# Patient Record
Sex: Male | Born: 1950
Health system: Southern US, Community
[De-identification: ages and names within clinical notes are randomized; demographics above are authoritative.]

## PROBLEM LIST (undated history)

## (undated) DIAGNOSIS — K219 Gastro-esophageal reflux disease without esophagitis: Secondary | ICD-10-CM

## (undated) DIAGNOSIS — N529 Male erectile dysfunction, unspecified: Secondary | ICD-10-CM

## (undated) HISTORY — DX: Male erectile dysfunction, unspecified: N52.9

## (undated) HISTORY — DX: Gastro-esophageal reflux disease without esophagitis: K21.9

---

## 1989-06-26 HISTORY — PX: URETHRA SURGERY: SHX824

## 2005-04-07 ENCOUNTER — Ambulatory Visit: Payer: Self-pay | Admitting: General Surgery

## 2005-05-13 ENCOUNTER — Ambulatory Visit: Payer: Self-pay | Admitting: General Surgery

## 2008-09-13 ENCOUNTER — Ambulatory Visit: Payer: Self-pay | Admitting: Urology

## 2010-05-05 ENCOUNTER — Ambulatory Visit: Payer: Self-pay | Admitting: Gastroenterology

## 2010-05-06 LAB — PATHOLOGY REPORT

## 2015-02-14 ENCOUNTER — Encounter: Payer: Self-pay | Admitting: Family Medicine

## 2015-02-14 DIAGNOSIS — K589 Irritable bowel syndrome without diarrhea: Secondary | ICD-10-CM

## 2015-02-14 DIAGNOSIS — K5730 Diverticulosis of large intestine without perforation or abscess without bleeding: Secondary | ICD-10-CM

## 2015-02-14 DIAGNOSIS — Z862 Personal history of diseases of the blood and blood-forming organs and certain disorders involving the immune mechanism: Secondary | ICD-10-CM

## 2015-02-14 DIAGNOSIS — E785 Hyperlipidemia, unspecified: Secondary | ICD-10-CM

## 2015-02-14 DIAGNOSIS — E559 Vitamin D deficiency, unspecified: Secondary | ICD-10-CM

## 2015-02-14 DIAGNOSIS — N529 Male erectile dysfunction, unspecified: Secondary | ICD-10-CM

## 2015-02-14 DIAGNOSIS — N135 Crossing vessel and stricture of ureter without hydronephrosis: Secondary | ICD-10-CM

## 2015-02-14 DIAGNOSIS — K5790 Diverticulosis of intestine, part unspecified, without perforation or abscess without bleeding: Secondary | ICD-10-CM | POA: Insufficient documentation

## 2015-03-27 ENCOUNTER — Telehealth: Payer: Self-pay | Admitting: Family Medicine

## 2015-03-27 NOTE — Telephone Encounter (Signed)
Pt informed his insurance will not cover Cialis

## 2015-04-11 ENCOUNTER — Other Ambulatory Visit: Payer: Self-pay | Admitting: Family Medicine

## 2015-06-24 ENCOUNTER — Other Ambulatory Visit: Payer: Self-pay

## 2015-06-24 DIAGNOSIS — N522 Drug-induced erectile dysfunction: Secondary | ICD-10-CM

## 2015-06-24 MED ORDER — TADALAFIL 20 MG PO TABS: 10.0000 mg | ORAL_TABLET | Freq: Every day | ORAL | Status: DC | PRN

## 2015-08-13 ENCOUNTER — Other Ambulatory Visit: Payer: Self-pay

## 2015-12-24 DIAGNOSIS — H2513 Age-related nuclear cataract, bilateral: Secondary | ICD-10-CM | POA: Diagnosis not present

## 2015-12-24 DIAGNOSIS — H18413 Arcus senilis, bilateral: Secondary | ICD-10-CM | POA: Diagnosis not present

## 2015-12-24 DIAGNOSIS — H401231 Low-tension glaucoma, bilateral, mild stage: Secondary | ICD-10-CM | POA: Diagnosis not present

## 2015-12-24 DIAGNOSIS — H47233 Glaucomatous optic atrophy, bilateral: Secondary | ICD-10-CM | POA: Diagnosis not present

## 2016-01-30 DIAGNOSIS — H18413 Arcus senilis, bilateral: Secondary | ICD-10-CM | POA: Diagnosis not present

## 2016-01-30 DIAGNOSIS — H5203 Hypermetropia, bilateral: Secondary | ICD-10-CM | POA: Diagnosis not present

## 2016-01-30 DIAGNOSIS — H2513 Age-related nuclear cataract, bilateral: Secondary | ICD-10-CM | POA: Diagnosis not present

## 2016-01-30 DIAGNOSIS — H47233 Glaucomatous optic atrophy, bilateral: Secondary | ICD-10-CM | POA: Diagnosis not present

## 2016-01-30 DIAGNOSIS — H52223 Regular astigmatism, bilateral: Secondary | ICD-10-CM | POA: Diagnosis not present

## 2016-01-30 DIAGNOSIS — H401231 Low-tension glaucoma, bilateral, mild stage: Secondary | ICD-10-CM | POA: Diagnosis not present

## 2016-03-10 ENCOUNTER — Ambulatory Visit (INDEPENDENT_AMBULATORY_CARE_PROVIDER_SITE_OTHER): Payer: Medicare Other | Admitting: Family Medicine

## 2016-03-10 ENCOUNTER — Encounter: Payer: Self-pay | Admitting: Family Medicine

## 2016-03-10 VITALS — BP 120/70 | HR 70 | Ht 75.0 in | Wt 187.0 lb

## 2016-03-10 DIAGNOSIS — K589 Irritable bowel syndrome without diarrhea: Secondary | ICD-10-CM

## 2016-03-10 DIAGNOSIS — E785 Hyperlipidemia, unspecified: Secondary | ICD-10-CM | POA: Diagnosis not present

## 2016-03-10 DIAGNOSIS — H409 Unspecified glaucoma: Secondary | ICD-10-CM | POA: Diagnosis not present

## 2016-03-10 DIAGNOSIS — N529 Male erectile dysfunction, unspecified: Secondary | ICD-10-CM

## 2016-03-10 DIAGNOSIS — Z23 Encounter for immunization: Secondary | ICD-10-CM | POA: Diagnosis not present

## 2016-03-10 DIAGNOSIS — E559 Vitamin D deficiency, unspecified: Secondary | ICD-10-CM | POA: Diagnosis not present

## 2016-03-10 MED ORDER — TADALAFIL 20 MG PO TABS: 10.0000 mg | ORAL_TABLET | ORAL | Status: DC | PRN

## 2016-03-10 MED ORDER — TADALAFIL 20 MG PO TABS: 10.0000 mg | ORAL_TABLET | Freq: Every day | ORAL | Status: DC | PRN

## 2016-03-10 NOTE — Progress Notes (Signed)
Date:  03/10/2016   Name:  Patrick Mcknight   DOB:  January 07, 1951   MRN:  [id number]  PCP:  No primary care provider on file.    Chief Complaint: Follow-up   History of Present Illness:  This is a 65 y.o. male seen in one year f/u. Doing well. Remains active as softball umpire. Was unable to get Cialis covered last year but has changed insurance and would like to try again. Intolerant Viagra (nausea) and Levitra (headache) in past. Taking asa daily for primary prevention. IBS sxs stable. Seeing ophtho q72m for glaucoma, on Lumigan. Takes vit D 4000 units daily, vit D level last year 45.  Review of Systems:  Review of Systems  Constitutional: Negative for fever and fatigue.  Respiratory: Negative for cough and shortness of breath.   Cardiovascular: Negative for chest pain and leg swelling.  Endocrine: Negative for polyuria.  Genitourinary: Negative for difficulty urinating.  Neurological: Negative for syncope and light-headedness.    Patient Active Problem List   Diagnosis Date Noted  . Diverticulosis 02/14/2015  . Irritable bowel syndrome (IBS) 02/14/2015  . Hx of iron deficiency anemia 02/14/2015  . Hyperlipemia intolerant statins 02/14/2015  . Vitamin D deficiency 02/14/2015  . Erectile dysfunction 02/14/2015  . Ureteral stricture s/p stent 02/14/2015    Prior to Admission medications   Medication Sig Start Date End Date Taking? Authorizing Provider  Cholecalciferol (VITAMIN D) 2000 UNITS tablet Take 4,000 Units by mouth daily.   Yes Historical Provider, MD  LUMIGAN 0.01 % SOLN Place 1 drop into both eyes at bedtime. 02/27/15  Yes Historical Provider, MD  Multiple Vitamin (MULTIVITAMIN) tablet Take 1 tablet by mouth daily.   Yes Historical Provider, MD  tadalafil (CIALIS) 20 MG tablet Take 0.5 tablets (10 mg total) by mouth daily as needed for erectile dysfunction. 03/10/16   Schuyler Amor, MD    Allergies  Allergen Reactions  . Crestor [Rosuvastatin]     kidney pain    Past  Surgical History  Procedure Laterality Date  . Urethra surgery  1990's    stent placement    Social History  Substance Use Topics  . Smoking status: Never Smoker   . Smokeless tobacco: None  . Alcohol Use: 0.0 oz/week    0 Standard drinks or equivalent per week    History reviewed. No pertinent family history.  Medication list has been reviewed and updated.  Physical Examination: BP 120/70 mmHg  Pulse 70  Ht  (1.905 m)  Wt 187 lb (84.823 kg)  BMI 23.37 kg/m2  Physical Exam  Constitutional: He appears well-developed and well-nourished.  HENT:  Right Ear: External ear normal.  Left Ear: External ear normal.  Mouth/Throat: Oropharynx is clear and moist.  Eyes: Conjunctivae are normal. Pupils are equal, round, and reactive to light.  Neck: Neck supple. No thyromegaly present.  Cardiovascular: Normal rate, regular rhythm and normal heart sounds.   Pulmonary/Chest: Effort normal and breath sounds normal.  Abdominal: Soft. He exhibits no distension and no mass. There is no tenderness.  Musculoskeletal: He exhibits no edema.  Lymphadenopathy:    He has no cervical adenopathy.  Neurological: He is alert. Coordination normal.  Skin: Skin is warm and dry.  Psychiatric: He has a normal mood and affect. His behavior is normal.  Nursing note and vitals reviewed.   Assessment and Plan:  1. Erectile dysfunction, unspecified erectile dysfunction type Reorder Cialis, insurance should cover given past intolerance Viagra and Levitra  2. Vitamin D deficiency  Well controlled on supplement  3. Irritable bowel syndrome (IBS) Sxs stable  4. Hyperlipemia intolerant statins TC 251 HDL 67 last year, 10 yr CVR 8.7%, recommend d/c daily asa  5. Glaucoma Followed by ophtho  6. Need for pneumococcal vaccination Prevnar now, plan Pneumovax next year - Pneumococcal conjugate vaccine 13-valent  Return in about 1 year (around 03/10/2017).  Dionne AnoWilliam M. Kingsley SpittlePlonk, Jr. MD [facility]  03/10/2016

## 2016-03-25 DIAGNOSIS — H2513 Age-related nuclear cataract, bilateral: Secondary | ICD-10-CM | POA: Diagnosis not present

## 2016-03-25 DIAGNOSIS — H5203 Hypermetropia, bilateral: Secondary | ICD-10-CM | POA: Diagnosis not present

## 2016-03-25 DIAGNOSIS — H47233 Glaucomatous optic atrophy, bilateral: Secondary | ICD-10-CM | POA: Diagnosis not present

## 2016-03-25 DIAGNOSIS — H18413 Arcus senilis, bilateral: Secondary | ICD-10-CM | POA: Diagnosis not present

## 2016-03-25 DIAGNOSIS — H401231 Low-tension glaucoma, bilateral, mild stage: Secondary | ICD-10-CM | POA: Diagnosis not present

## 2016-03-25 DIAGNOSIS — H52223 Regular astigmatism, bilateral: Secondary | ICD-10-CM | POA: Diagnosis not present

## 2016-07-15 DIAGNOSIS — H18413 Arcus senilis, bilateral: Secondary | ICD-10-CM | POA: Diagnosis not present

## 2016-07-15 DIAGNOSIS — H2513 Age-related nuclear cataract, bilateral: Secondary | ICD-10-CM | POA: Diagnosis not present

## 2016-07-15 DIAGNOSIS — H52223 Regular astigmatism, bilateral: Secondary | ICD-10-CM | POA: Diagnosis not present

## 2016-07-15 DIAGNOSIS — H401231 Low-tension glaucoma, bilateral, mild stage: Secondary | ICD-10-CM | POA: Diagnosis not present

## 2016-07-15 DIAGNOSIS — H47233 Glaucomatous optic atrophy, bilateral: Secondary | ICD-10-CM | POA: Diagnosis not present

## 2016-07-15 DIAGNOSIS — H5203 Hypermetropia, bilateral: Secondary | ICD-10-CM | POA: Diagnosis not present

## 2016-09-08 DIAGNOSIS — R350 Frequency of micturition: Secondary | ICD-10-CM | POA: Diagnosis not present

## 2016-09-08 DIAGNOSIS — N401 Enlarged prostate with lower urinary tract symptoms: Secondary | ICD-10-CM | POA: Diagnosis not present

## 2016-09-08 DIAGNOSIS — N5201 Erectile dysfunction due to arterial insufficiency: Secondary | ICD-10-CM | POA: Diagnosis not present

## 2016-11-17 DIAGNOSIS — H47233 Glaucomatous optic atrophy, bilateral: Secondary | ICD-10-CM | POA: Diagnosis not present

## 2016-11-17 DIAGNOSIS — H401231 Low-tension glaucoma, bilateral, mild stage: Secondary | ICD-10-CM | POA: Diagnosis not present

## 2016-11-17 DIAGNOSIS — H52223 Regular astigmatism, bilateral: Secondary | ICD-10-CM | POA: Diagnosis not present

## 2016-11-17 DIAGNOSIS — H5203 Hypermetropia, bilateral: Secondary | ICD-10-CM | POA: Diagnosis not present

## 2016-11-17 DIAGNOSIS — H2513 Age-related nuclear cataract, bilateral: Secondary | ICD-10-CM | POA: Diagnosis not present

## 2016-11-17 DIAGNOSIS — H18413 Arcus senilis, bilateral: Secondary | ICD-10-CM | POA: Diagnosis not present

## 2017-02-15 DIAGNOSIS — H401231 Low-tension glaucoma, bilateral, mild stage: Secondary | ICD-10-CM | POA: Diagnosis not present

## 2017-02-15 DIAGNOSIS — H18413 Arcus senilis, bilateral: Secondary | ICD-10-CM | POA: Diagnosis not present

## 2017-02-15 DIAGNOSIS — H2513 Age-related nuclear cataract, bilateral: Secondary | ICD-10-CM | POA: Diagnosis not present

## 2017-02-15 DIAGNOSIS — H47233 Glaucomatous optic atrophy, bilateral: Secondary | ICD-10-CM | POA: Diagnosis not present

## 2017-03-09 ENCOUNTER — Ambulatory Visit (INDEPENDENT_AMBULATORY_CARE_PROVIDER_SITE_OTHER): Payer: Medicare Other | Admitting: Family Medicine

## 2017-03-09 ENCOUNTER — Encounter: Payer: Self-pay | Admitting: Family Medicine

## 2017-03-09 VITALS — BP 120/78 | HR 58 | Resp 16 | Ht 75.0 in | Wt 182.0 lb

## 2017-03-09 DIAGNOSIS — E559 Vitamin D deficiency, unspecified: Secondary | ICD-10-CM

## 2017-03-09 DIAGNOSIS — Z23 Encounter for immunization: Secondary | ICD-10-CM | POA: Diagnosis not present

## 2017-03-09 DIAGNOSIS — K589 Irritable bowel syndrome without diarrhea: Secondary | ICD-10-CM

## 2017-03-09 DIAGNOSIS — N529 Male erectile dysfunction, unspecified: Secondary | ICD-10-CM | POA: Diagnosis not present

## 2017-03-09 DIAGNOSIS — H409 Unspecified glaucoma: Secondary | ICD-10-CM | POA: Insufficient documentation

## 2017-03-09 NOTE — Patient Instructions (Addendum)
You may stop aspirin and fish oil.  We will get blood work at your next visit.

## 2017-03-09 NOTE — Progress Notes (Signed)
Date:  03/09/2017   Name:  Patrick Mcknight   DOB:  [birth year]   MRN:  [id number]  PCP:  Schuyler AmorPlonk, Kobyn, MD    Chief Complaint: Erectile Dysfunction (Follow Up ) and Hyperlipidemia (Follow Up - Last notye states need Pneumovax )   History of Present Illness:  This is a 66 y.o. male seen for one year f/u. IBS stable, ED only rarely an issue, insurance never approved Cialis. Never stopped asa or fish oil, prefers to continue. Saw optho for glaucoma 2 weeks ago, remains on Lumigan gtts. Due for Pneumovax today, tet imm 3 yrs ago, colonoscopy age 66 normal. Stays active, exercises daily.  Review of Systems:  Review of Systems  Constitutional: Negative for chills and fever.  Respiratory: Negative for cough and shortness of breath.   Cardiovascular: Negative for chest pain and leg swelling.  Genitourinary: Negative for difficulty urinating.  Neurological: Negative for syncope and light-headedness.    Patient Active Problem List   Diagnosis Date Noted  . Glaucoma 03/09/2017  . Diverticulosis 02/14/2015  . Irritable bowel syndrome (IBS) 02/14/2015  . Hx of iron deficiency anemia 02/14/2015  . Hyperlipemia intolerant statins 02/14/2015  . Vitamin D deficiency 02/14/2015  . Erectile dysfunction 02/14/2015  . Ureteral stricture s/p stent 02/14/2015    Prior to Admission medications   Medication Sig Start Date End Date Taking? Authorizing Provider  aspirin EC 81 MG tablet Take 81 mg by mouth daily.   Yes [provider]  Cholecalciferol (VITAMIN D3) 5000 units CAPS Take 1 capsule by mouth daily.   Yes [provider]  LUMIGAN 0.01 % SOLN Place 1 drop into both eyes at bedtime. 02/27/15  Yes [provider]  Multiple Vitamin (MULTIVITAMIN) tablet Take 1 tablet by mouth daily.   Yes [provider]  Omega-3 Fatty Acids (FISH OIL CONCENTRATE PO) Take by mouth.   Yes [provider]    Allergies  Allergen Reactions  . Crestor [Rosuvastatin]    kidney pain    Past Surgical History:  Procedure Laterality Date  . URETHRA SURGERY  1990's   stent placement    Social History  Substance Use Topics  . Smoking status: Never Smoker  . Smokeless tobacco: Never Used  . Alcohol use 0.0 oz/week    Family History  Problem Relation Age of Onset  . Family history unknown: Yes    Medication list has been reviewed and updated.  Physical Examination: BP 120/78   Pulse (!) 58   Resp 16   Ht 6\' 3"  (1.905 m)   Wt 182 lb (82.6 kg)   SpO2 99%   BMI 22.75 kg/m   Physical Exam  Constitutional: He appears well-developed and well-nourished.  Cardiovascular: Normal rate, regular rhythm and normal heart sounds.   Pulmonary/Chest: Effort normal and breath sounds normal.  Musculoskeletal: He exhibits no edema.  Neurological: He is alert.  Skin: Skin is warm and dry.  Psychiatric: He has a normal mood and affect. His behavior is normal.  Nursing note and vitals reviewed.   Assessment and Plan:  1. Irritable bowel syndrome, unspecified type Stable, cont high fiber diet  2. Erectile dysfunction, unspecified erectile dysfunction type Intermittent only, declines rx  3. Vitamin D deficiency On supplement, consider level next visit  4. Glaucoma, unspecified glaucoma type, unspecified laterality On Lumigan, followed by ophtho  5. Need for pneumococcal vaccination - Pneumococcal polysaccharide vaccine 23-valent greater than or equal to 2yo subcutaneous/IM  6. Med review Recommend d/c asa/fish oil  but pt prefers to continue  Return in about 1 year (around 03/09/2018).  Dionne Ano. Kingsley Spittle MD [facility]  03/09/2017

## 2017-03-11 ENCOUNTER — Ambulatory Visit: Payer: Medicare Other | Admitting: Family Medicine

## 2017-05-18 ENCOUNTER — Encounter: Payer: Self-pay | Admitting: Family Medicine

## 2017-05-18 ENCOUNTER — Ambulatory Visit (INDEPENDENT_AMBULATORY_CARE_PROVIDER_SITE_OTHER): Payer: Medicare Other | Admitting: Family Medicine

## 2017-05-18 VITALS — BP 122/84 | HR 66 | Resp 16 | Ht 75.0 in | Wt 181.0 lb

## 2017-05-18 DIAGNOSIS — H409 Unspecified glaucoma: Secondary | ICD-10-CM | POA: Diagnosis not present

## 2017-05-18 DIAGNOSIS — Z91018 Allergy to other foods: Secondary | ICD-10-CM | POA: Diagnosis not present

## 2017-05-18 DIAGNOSIS — H2513 Age-related nuclear cataract, bilateral: Secondary | ICD-10-CM | POA: Diagnosis not present

## 2017-05-18 DIAGNOSIS — H47233 Glaucomatous optic atrophy, bilateral: Secondary | ICD-10-CM | POA: Diagnosis not present

## 2017-05-18 DIAGNOSIS — H401231 Low-tension glaucoma, bilateral, mild stage: Secondary | ICD-10-CM | POA: Diagnosis not present

## 2017-05-18 DIAGNOSIS — H18413 Arcus senilis, bilateral: Secondary | ICD-10-CM | POA: Diagnosis not present

## 2017-05-18 DIAGNOSIS — R600 Localized edema: Secondary | ICD-10-CM

## 2017-05-18 DIAGNOSIS — E559 Vitamin D deficiency, unspecified: Secondary | ICD-10-CM | POA: Diagnosis not present

## 2017-05-18 MED ORDER — CETIRIZINE HCL 10 MG PO TABS: 10.0000 mg | ORAL_TABLET | Freq: Every day | ORAL | 11 refills | Status: DC | PRN

## 2017-05-18 NOTE — Progress Notes (Signed)
Date:  05/18/2017   Name:  Patrick Mcknight   DOB:  03-05-1951   MRN:  [id number]  PCP:  Schuyler Amor, MD    Chief Complaint: Allergic Reaction (Ate pie and had tea and is not sure if that is what caused lip swelling. Had family reunion. swollwn and sore. )   History of Present Illness:  This is a 66 y.o. male seen for upper lip edema since yesterday, has citrus allergy, thinks exposed to some in pie he ate Sunday night. Better during day yesterday but slightly worse this am, no SOB.  Review of Systems:  Review of Systems  Constitutional: Negative for chills and fever.  Respiratory: Negative for cough and shortness of breath.   Cardiovascular: Negative for chest pain and leg swelling.  Gastrointestinal: Negative for abdominal pain.  Neurological: Negative for syncope and light-headedness.    Patient Active Problem List   Diagnosis Date Noted  . Glaucoma 03/09/2017  . Diverticulosis 02/14/2015  . Irritable bowel syndrome (IBS) 02/14/2015  . Hx of iron deficiency anemia 02/14/2015  . Hyperlipemia intolerant statins 02/14/2015  . Vitamin D deficiency 02/14/2015  . Erectile dysfunction 02/14/2015  . Ureteral stricture s/p stent 02/14/2015    Prior to Admission medications   Medication Sig Start Date End Date Taking? Authorizing Provider  aspirin EC 81 MG tablet Take 81 mg by mouth daily.   Yes [provider]  Cholecalciferol (VITAMIN D3) 5000 units CAPS Take 1 capsule by mouth daily.   Yes [provider]  LUMIGAN 0.01 % SOLN Place 1 drop into both eyes at bedtime. 02/27/15  Yes [provider]  Multiple Vitamin (MULTIVITAMIN) tablet Take 1 tablet by mouth daily.   Yes [provider]  Omega-3 Fatty Acids (FISH OIL CONCENTRATE PO) Take by mouth.   Yes [provider]  cetirizine (ZYRTEC) 10 MG tablet Take 1 tablet (10 mg total) by mouth daily as needed for allergies. 05/18/17   Schuyler Amor, MD    Allergies  Allergen Reactions  .  Crestor [Rosuvastatin]     kidney pain    Past Surgical History:  Procedure Laterality Date  . URETHRA SURGERY  1990's   stent placement    Social History  Substance Use Topics  . Smoking status: Never Smoker  . Smokeless tobacco: Never Used  . Alcohol use 0.0 oz/week    Family History  Problem Relation Age of Onset  . Family history unknown: Yes    Medication list has been reviewed and updated.  Physical Examination: BP 122/84   Pulse 66   Resp 16   Ht 6\' 3"  (1.905 m)   Wt 181 lb (82.1 kg)   SpO2 98%   BMI 22.62 kg/m   Physical Exam  Constitutional: He appears well-developed and well-nourished.  HENT:  Mild-mod upper lip edema, no erythema/tenderness  Cardiovascular: Normal rate, regular rhythm and normal heart sounds.   Pulmonary/Chest: Effort normal and breath sounds normal.  Musculoskeletal: He exhibits no edema.  Neurological: He is alert.  Skin: Skin is warm and dry.  Psychiatric: He has a normal mood and affect. His behavior is normal.  Nursing note and vitals reviewed.   Assessment and Plan:  1. Facial edema Likely due to citrus exposure, Zyrtec prn, call if sxs worsen/persist  2. Food allergy Avoid citrus  3. Vitamin D deficiency On supplement, level next visit  4. Glaucoma, unspecified glaucoma type, unspecified laterality Cont Lumigan per optho  5. Med review Consider d/c fish oil/asa next  visit  Return if symptoms worsen or fail to improve.  Dionne AnoWilliam M. Kingsley SpittlePlonk, Jr. MD [facility]  05/18/2017

## 2017-05-18 NOTE — Patient Instructions (Signed)
Take Zyrtec (generic ok) daily until swelling resolves.

## 2017-06-07 DIAGNOSIS — H47233 Glaucomatous optic atrophy, bilateral: Secondary | ICD-10-CM | POA: Diagnosis not present

## 2017-06-07 DIAGNOSIS — H18413 Arcus senilis, bilateral: Secondary | ICD-10-CM | POA: Diagnosis not present

## 2017-06-07 DIAGNOSIS — H2513 Age-related nuclear cataract, bilateral: Secondary | ICD-10-CM | POA: Diagnosis not present

## 2017-06-07 DIAGNOSIS — H401231 Low-tension glaucoma, bilateral, mild stage: Secondary | ICD-10-CM | POA: Diagnosis not present

## 2017-06-14 ENCOUNTER — Other Ambulatory Visit: Payer: Self-pay

## 2017-09-08 DIAGNOSIS — R350 Frequency of micturition: Secondary | ICD-10-CM | POA: Diagnosis not present

## 2017-09-08 DIAGNOSIS — N401 Enlarged prostate with lower urinary tract symptoms: Secondary | ICD-10-CM | POA: Diagnosis not present

## 2017-09-08 DIAGNOSIS — E291 Testicular hypofunction: Secondary | ICD-10-CM | POA: Diagnosis not present

## 2017-09-14 DIAGNOSIS — H47233 Glaucomatous optic atrophy, bilateral: Secondary | ICD-10-CM | POA: Diagnosis not present

## 2017-09-14 DIAGNOSIS — H2513 Age-related nuclear cataract, bilateral: Secondary | ICD-10-CM | POA: Diagnosis not present

## 2017-09-14 DIAGNOSIS — H18413 Arcus senilis, bilateral: Secondary | ICD-10-CM | POA: Diagnosis not present

## 2017-09-14 DIAGNOSIS — H401231 Low-tension glaucoma, bilateral, mild stage: Secondary | ICD-10-CM | POA: Diagnosis not present

## 2017-12-21 DIAGNOSIS — H47233 Glaucomatous optic atrophy, bilateral: Secondary | ICD-10-CM | POA: Diagnosis not present

## 2017-12-21 DIAGNOSIS — H18413 Arcus senilis, bilateral: Secondary | ICD-10-CM | POA: Diagnosis not present

## 2017-12-21 DIAGNOSIS — H2513 Age-related nuclear cataract, bilateral: Secondary | ICD-10-CM | POA: Diagnosis not present

## 2017-12-21 DIAGNOSIS — H401231 Low-tension glaucoma, bilateral, mild stage: Secondary | ICD-10-CM | POA: Diagnosis not present

## 2017-12-30 DIAGNOSIS — Q6211 Congenital occlusion of ureteropelvic junction: Secondary | ICD-10-CM | POA: Diagnosis not present

## 2017-12-30 DIAGNOSIS — N401 Enlarged prostate with lower urinary tract symptoms: Secondary | ICD-10-CM | POA: Diagnosis not present

## 2017-12-30 DIAGNOSIS — M5489 Other dorsalgia: Secondary | ICD-10-CM | POA: Diagnosis not present

## 2017-12-30 DIAGNOSIS — N5201 Erectile dysfunction due to arterial insufficiency: Secondary | ICD-10-CM | POA: Diagnosis not present

## 2018-01-05 ENCOUNTER — Other Ambulatory Visit: Payer: Self-pay | Admitting: Urology

## 2018-01-05 DIAGNOSIS — Q6211 Congenital occlusion of ureteropelvic junction: Secondary | ICD-10-CM

## 2018-01-05 DIAGNOSIS — M545 Low back pain: Secondary | ICD-10-CM

## 2018-01-05 DIAGNOSIS — R109 Unspecified abdominal pain: Secondary | ICD-10-CM

## 2018-01-05 DIAGNOSIS — Q6239 Other obstructive defects of renal pelvis and ureter: Secondary | ICD-10-CM

## 2018-01-14 ENCOUNTER — Ambulatory Visit
Admission: RE | Admit: 2018-01-14 | Discharge: 2018-01-14 | Disposition: A | Discharge status: Home / Self Care | Payer: Medicare Other | Source: Ambulatory Visit | Attending: Urology | Admitting: Urology

## 2018-01-14 DIAGNOSIS — R109 Unspecified abdominal pain: Secondary | ICD-10-CM

## 2018-01-14 DIAGNOSIS — M545 Low back pain: Secondary | ICD-10-CM

## 2018-01-14 DIAGNOSIS — Q6211 Congenital occlusion of ureteropelvic junction: Secondary | ICD-10-CM | POA: Insufficient documentation

## 2018-01-14 DIAGNOSIS — Q6239 Other obstructive defects of renal pelvis and ureter: Secondary | ICD-10-CM

## 2018-01-14 DIAGNOSIS — I700 Atherosclerosis of aorta: Secondary | ICD-10-CM | POA: Diagnosis not present

## 2018-01-14 LAB — POCT I-STAT CREATININE: Creatinine, Ser: 1.1 mg/dL (ref 0.61–1.24)

## 2018-01-14 MED ORDER — IOPAMIDOL (ISOVUE-300) INJECTION 61%
100.0000 mL | Freq: Once | INTRAVENOUS | Status: AC | PRN
  Administered 2018-01-14: 100 mL via INTRAVENOUS

## 2018-01-17 DIAGNOSIS — N5201 Erectile dysfunction due to arterial insufficiency: Secondary | ICD-10-CM | POA: Diagnosis not present

## 2018-03-07 ENCOUNTER — Ambulatory Visit (INDEPENDENT_AMBULATORY_CARE_PROVIDER_SITE_OTHER): Payer: Medicare Other | Admitting: Family Medicine

## 2018-03-07 ENCOUNTER — Encounter: Payer: Self-pay | Admitting: Family Medicine

## 2018-03-07 ENCOUNTER — Other Ambulatory Visit
Admission: RE | Admit: 2018-03-07 | Discharge: 2018-03-07 | Disposition: A | Discharge status: Home / Self Care | Payer: Medicare Other | Source: Ambulatory Visit | Attending: Family Medicine | Admitting: Family Medicine

## 2018-03-07 VITALS — BP 120/62 | HR 64 | Ht 75.0 in | Wt 180.0 lb

## 2018-03-07 DIAGNOSIS — Z1211 Encounter for screening for malignant neoplasm of colon: Secondary | ICD-10-CM | POA: Diagnosis not present

## 2018-03-07 DIAGNOSIS — N400 Enlarged prostate without lower urinary tract symptoms: Secondary | ICD-10-CM | POA: Insufficient documentation

## 2018-03-07 DIAGNOSIS — I700 Atherosclerosis of aorta: Secondary | ICD-10-CM | POA: Insufficient documentation

## 2018-03-07 DIAGNOSIS — E785 Hyperlipidemia, unspecified: Secondary | ICD-10-CM

## 2018-03-07 DIAGNOSIS — Z0000 Encounter for general adult medical examination without abnormal findings: Secondary | ICD-10-CM | POA: Insufficient documentation

## 2018-03-07 DIAGNOSIS — E559 Vitamin D deficiency, unspecified: Secondary | ICD-10-CM

## 2018-03-07 DIAGNOSIS — Z1159 Encounter for screening for other viral diseases: Secondary | ICD-10-CM | POA: Diagnosis not present

## 2018-03-07 LAB — RENAL FUNCTION PANEL
Albumin: 4 g/dL (ref 3.5–5.0)
Anion gap: 8 (ref 5–15)
BUN: 11 mg/dL (ref 6–20)
CALCIUM: 8.9 mg/dL (ref 8.9–10.3)
CO2: 28 mmol/L (ref 22–32)
CREATININE: 1.1 mg/dL (ref 0.61–1.24)
Chloride: 101 mmol/L (ref 101–111)
GFR calc non Af Amer: >60 mL/min (ref >60)
GLUCOSE: 91 mg/dL (ref 65–99)
PHOSPHORUS: 3 mg/dL (ref 2.5–4.6)
Potassium: 4.1 mmol/L (ref 3.5–5.1)
SODIUM: 137 mmol/L (ref 135–145)

## 2018-03-07 LAB — HEMOCCULT GUIAC POC 1CARD (OFFICE): FECAL OCCULT BLD: NEGATIVE

## 2018-03-07 LAB — LIPID PANEL
Cholesterol: 228 mg/dL — ABNORMAL HIGH (ref 0–200)
HDL: 59 mg/dL (ref >40)
LDL CALC: 151 mg/dL — AB (ref 0–99)
Total CHOL/HDL Ratio: 3.9 RATIO
Triglycerides: 91 mg/dL (ref <150)
VLDL: 18 mg/dL (ref 0–40)

## 2018-03-07 LAB — PSA: PROSTATIC SPECIFIC ANTIGEN: 0.47 ng/mL (ref 0.00–4.00)

## 2018-03-07 NOTE — Progress Notes (Signed)
Name: Patrick Mcknight   MRN: [id number]    DOB: 24-Mar-1951   Date:03/07/2018       Progress Note  Subjective  Chief Complaint  Chief Complaint  Patient presents with  . Annual Exam    is still having an "aching kidney pain"- was seeing someone at [facility], but he retired    Patient presents for annual physical exam.  Back Pain  This is a recurrent problem. The current episode started more than 1 year ago. The problem has been waxing and waning since onset. Pain location: lumbar/flank. The quality of the pain is described as aching. The pain is at a severity of 3/10. The pain is mild. Pertinent negatives include no abdominal pain, bladder incontinence, bowel incontinence, chest pain, dysuria, fever, headaches, numbness, tingling or weight loss.    No problem-specific Assessment & Plan notes found for this encounter.   Past Medical History:  Diagnosis Date  . Erectile dysfunction     Past Surgical History:  Procedure Laterality Date  . URETHRA SURGERY  1990's   stent placement    Family History  Family history unknown: Yes    Social History   Socioeconomic History  . Marital status: Married    Spouse name: Not on file  . Number of children: Not on file  . Years of education: Not on file  . Highest education level: Not on file  Occupational History  . Not on file  Social Needs  . Financial resource strain: Not on file  . Food insecurity:    Worry: Not on file    Inability: Not on file  . Transportation needs:    Medical: Not on file    Non-medical: Not on file  Tobacco Use  . Smoking status: Never Smoker  . Smokeless tobacco: Never Used  Substance and Sexual Activity  . Alcohol use: Yes    Alcohol/week: 0.0 oz  . Drug use: No  . Sexual activity: Yes  Lifestyle  . Physical activity:    Days per week: Not on file    Minutes per session: Not on file  . Stress: Not on file  Relationships  . Social connections:    Talks on phone: Not on file    Gets together:  Not on file    Attends religious service: Not on file    Active member of club or organization: Not on file    Attends meetings of clubs or organizations: Not on file    Relationship status: Not on file  . Intimate partner violence:    Fear of current or ex partner: Not on file    Emotionally abused: Not on file    Physically abused: Not on file    Forced sexual activity: Not on file  Other Topics Concern  . Not on file  Social History Narrative  . Not on file    Allergies  Allergen Reactions  . Crestor [Rosuvastatin]     kidney pain    Outpatient Medications Prior to Visit  Medication Sig Dispense Refill  . aspirin EC 81 MG tablet Take 81 mg by mouth daily.    . Cholecalciferol (VITAMIN D3) 5000 units CAPS Take 1 capsule by mouth daily.    Marland Kitchen LUMIGAN 0.01 % SOLN Place 1 drop into both eyes at bedtime.  3  . Multiple Vitamin (MULTIVITAMIN) tablet Take 1 tablet by mouth daily.    . Omega-3 Fatty Acids (FISH OIL CONCENTRATE PO) Take by mouth.    . cetirizine (ZYRTEC)  10 MG tablet Take 1 tablet (10 mg total) by mouth daily as needed for allergies. 30 tablet 11   No facility-administered medications prior to visit.     Review of Systems  Constitutional: Negative for chills, fever, malaise/fatigue and weight loss.  HENT: Negative for ear discharge, ear pain and sore throat.   Eyes: Negative for blurred vision.  Respiratory: Negative for cough, sputum production, shortness of breath and wheezing.   Cardiovascular: Negative for chest pain, palpitations and leg swelling.  Gastrointestinal: Negative for abdominal pain, blood in stool, bowel incontinence, constipation, diarrhea, heartburn, melena and nausea.  Genitourinary: Negative for bladder incontinence, dysuria, frequency, hematuria and urgency.  Musculoskeletal: Positive for back pain. Negative for joint pain, myalgias and neck pain.  Skin: Negative for rash.  Neurological: Negative for dizziness, tingling, sensory change,  focal weakness, numbness and headaches.  Endo/Heme/Allergies: Negative for environmental allergies and polydipsia. Does not bruise/bleed easily.  Psychiatric/Behavioral: Negative for depression and suicidal ideas. The patient is not nervous/anxious and does not have insomnia.      Objective  Vitals:   03/07/18 0929  BP: 120/62  Pulse: 64  Weight: 180 lb (81.6 kg)  Height:  (1.905 m)    Physical Exam  Constitutional: He is oriented to person, place, and time. Vital signs are normal. He appears well-developed and well-nourished.  HENT:  Head: Normocephalic.  Right Ear: Hearing, tympanic membrane, external ear and ear canal normal.  Left Ear: Hearing, tympanic membrane, external ear and ear canal normal.  Nose: Nose normal. No mucosal edema.  Mouth/Throat: Uvula is midline and oropharynx is clear and moist. Normal dentition. No oropharyngeal exudate, posterior oropharyngeal edema or posterior oropharyngeal erythema.  Eyes: Pupils are equal, round, and reactive to light. Conjunctivae, EOM and lids are normal. Right eye exhibits no discharge. Left eye exhibits no discharge. No scleral icterus.  Fundoscopic exam:      The right eye shows no arteriolar narrowing and no AV nicking.       The left eye shows no arteriolar narrowing and no AV nicking.  Neck: Trachea normal and normal range of motion. Neck supple. Normal carotid pulses, no hepatojugular reflux and no JVD present. Carotid bruit is not present. No tracheal deviation present. No thyromegaly present.  Cardiovascular: Normal rate, regular rhythm, S1 normal, S2 normal, normal heart sounds and intact distal pulses. PMI is not displaced. Exam reveals no gallop, no S3, no S4, no distant heart sounds and no friction rub.  No murmur heard. Pulses:      Carotid pulses are 2+ on the right side, and 2+ on the left side.      Radial pulses are 2+ on the right side, and 2+ on the left side.       Femoral pulses are 2+ on the right side,  and 2+ on the left side.      Popliteal pulses are 2+ on the right side, and 2+ on the left side.       Dorsalis pedis pulses are 2+ on the right side, and 2+ on the left side.       Posterior tibial pulses are 2+ on the right side, and 2+ on the left side.  Pulmonary/Chest: Breath sounds normal. No stridor. No respiratory distress. He has no decreased breath sounds. He has no wheezes. He has no rhonchi. He has no rales. Right breast exhibits no mass. Left breast exhibits no mass.  Abdominal: Soft. Normal aorta and bowel sounds are normal. He exhibits no  mass. There is no hepatosplenomegaly. There is no tenderness. There is no rebound, no guarding and no CVA tenderness. No hernia.  Genitourinary: Rectum normal, prostate normal, testes normal and penis normal. Rectal exam shows no mass and guaiac negative stool.  Musculoskeletal: Normal range of motion. He exhibits no edema or tenderness.       Cervical back: Normal.       Thoracic back: Normal.       Lumbar back: Normal.       Right foot: There is deformity. There is normal range of motion.  Lymphadenopathy:       Head (right side): No submental adenopathy present.       Head (left side): No submental adenopathy present.    He has no cervical adenopathy.    He has no axillary adenopathy.  Neurological: He is alert and oriented to person, place, and time. He has normal strength and normal reflexes. No cranial nerve deficit or sensory deficit.  Reflex Scores:      Tricep reflexes are 2+ on the right side and 2+ on the left side.      Bicep reflexes are 2+ on the right side and 2+ on the left side.      Brachioradialis reflexes are 2+ on the right side and 2+ on the left side.      Patellar reflexes are 2+ on the right side and 2+ on the left side.      Achilles reflexes are 2+ on the right side and 2+ on the left side. Skin: Skin is warm, dry and intact. No rash noted. He is not diaphoretic. No pallor.  Nursing note and vitals  reviewed.     Assessment & Plan  Problem List Items Addressed This Visit      Other   Hyperlipemia intolerant statins   Relevant Orders   Lipid panel   Vitamin D deficiency    Other Visit Diagnoses    Annual physical exam    -  Primary   Relevant Orders   Renal Function Panel   POCT occult blood stool (Completed)   Aortic atherosclerosis (HCC)       Prostate hypertrophy       Relevant Orders   PSA   Need for hepatitis C screening test       Relevant Orders   Hepatitis C antibody   Colon cancer screening       Relevant Orders   POCT occult blood stool (Completed)      No orders of the defined types were placed in this encounter.     Dr. [facility]  03/07/18

## 2018-03-08 LAB — HEPATITIS C ANTIBODY

## 2018-03-23 DIAGNOSIS — H2513 Age-related nuclear cataract, bilateral: Secondary | ICD-10-CM | POA: Diagnosis not present

## 2018-03-23 DIAGNOSIS — H47233 Glaucomatous optic atrophy, bilateral: Secondary | ICD-10-CM | POA: Diagnosis not present

## 2018-03-23 DIAGNOSIS — H401231 Low-tension glaucoma, bilateral, mild stage: Secondary | ICD-10-CM | POA: Diagnosis not present

## 2018-03-23 DIAGNOSIS — H5203 Hypermetropia, bilateral: Secondary | ICD-10-CM | POA: Diagnosis not present

## 2018-03-30 ENCOUNTER — Other Ambulatory Visit: Payer: Self-pay

## 2018-04-23 LAB — FECAL OCCULT BLOOD, GUAIAC: Fecal Occult Blood: NEGATIVE

## 2018-04-27 DIAGNOSIS — Z0000 Encounter for general adult medical examination without abnormal findings: Secondary | ICD-10-CM | POA: Diagnosis not present

## 2018-05-27 ENCOUNTER — Other Ambulatory Visit: Payer: Self-pay | Admitting: Family Medicine

## 2018-06-22 DIAGNOSIS — H401212 Low-tension glaucoma, right eye, moderate stage: Secondary | ICD-10-CM | POA: Diagnosis not present

## 2018-06-22 DIAGNOSIS — H401221 Low-tension glaucoma, left eye, mild stage: Secondary | ICD-10-CM | POA: Diagnosis not present

## 2018-06-22 DIAGNOSIS — H2513 Age-related nuclear cataract, bilateral: Secondary | ICD-10-CM | POA: Diagnosis not present

## 2018-09-20 DIAGNOSIS — H2513 Age-related nuclear cataract, bilateral: Secondary | ICD-10-CM | POA: Diagnosis not present

## 2018-09-20 DIAGNOSIS — H401221 Low-tension glaucoma, left eye, mild stage: Secondary | ICD-10-CM | POA: Diagnosis not present

## 2018-09-20 DIAGNOSIS — H401212 Low-tension glaucoma, right eye, moderate stage: Secondary | ICD-10-CM | POA: Diagnosis not present

## 2018-10-04 DIAGNOSIS — Z125 Encounter for screening for malignant neoplasm of prostate: Secondary | ICD-10-CM | POA: Diagnosis not present

## 2018-10-04 DIAGNOSIS — Q6211 Congenital occlusion of ureteropelvic junction: Secondary | ICD-10-CM | POA: Diagnosis not present

## 2018-10-04 DIAGNOSIS — N5201 Erectile dysfunction due to arterial insufficiency: Secondary | ICD-10-CM | POA: Diagnosis not present

## 2018-10-04 DIAGNOSIS — N401 Enlarged prostate with lower urinary tract symptoms: Secondary | ICD-10-CM | POA: Diagnosis not present

## 2018-12-15 ENCOUNTER — Encounter: Payer: Self-pay | Admitting: Family Medicine

## 2018-12-15 ENCOUNTER — Ambulatory Visit (INDEPENDENT_AMBULATORY_CARE_PROVIDER_SITE_OTHER): Payer: Medicare Other | Admitting: Family Medicine

## 2018-12-15 ENCOUNTER — Other Ambulatory Visit: Payer: Self-pay

## 2018-12-15 VITALS — BP 120/81 | HR 54 | Resp 16 | Ht 75.0 in | Wt 184.2 lb

## 2018-12-15 DIAGNOSIS — I700 Atherosclerosis of aorta: Secondary | ICD-10-CM | POA: Diagnosis not present

## 2018-12-15 DIAGNOSIS — H833X3 Noise effects on inner ear, bilateral: Secondary | ICD-10-CM

## 2018-12-15 DIAGNOSIS — L723 Sebaceous cyst: Secondary | ICD-10-CM | POA: Diagnosis not present

## 2018-12-15 NOTE — Progress Notes (Signed)
    Date:  12/15/2018   Name:  Patrick Mcknight   DOB:  09-29-1951   MRN:  [id number]   Chief Complaint: Back Problem (Bump on back - also on Right Foot Hx of Soft Tissue Lipoma )  Patient previous had surgery for sebacceous cyst in 2014. Recurrence at inferir aspect of scar   Review of Systems  Constitutional: Negative for chills and fever.  HENT: Negative for drooling, ear discharge, ear pain and sore throat.   Respiratory: Negative for cough, shortness of breath and wheezing.   Cardiovascular: Negative for chest pain, palpitations and leg swelling.  Gastrointestinal: Negative for abdominal pain, blood in stool, constipation, diarrhea and nausea.  Endocrine: Negative for polydipsia.  Genitourinary: Negative for dysuria, frequency, hematuria and urgency.  Musculoskeletal: Negative for back pain, myalgias and neck pain.  Skin: Negative for rash.  Allergic/Immunologic: Negative for environmental allergies.  Neurological: Negative for dizziness and headaches.  Hematological: Does not bruise/bleed easily.  Psychiatric/Behavioral: Negative for suicidal ideas. The patient is not nervous/anxious.     Patient Active Problem List   Diagnosis Date Noted  . Glaucoma 03/09/2017  . Diverticulosis 02/14/2015  . Irritable bowel syndrome (IBS) 02/14/2015  . Hx of iron deficiency anemia 02/14/2015  . Hyperlipemia intolerant statins 02/14/2015  . Vitamin D deficiency 02/14/2015  . Erectile dysfunction 02/14/2015  . Ureteral stricture s/p stent 02/14/2015    Allergies  Allergen Reactions  . Crestor [Rosuvastatin]     kidney pain    Past Surgical History:  Procedure Laterality Date  . URETHRA SURGERY  1990's   stent placement    Social History   Tobacco Use  . Smoking status: Never Smoker  . Smokeless tobacco: Never Used  Substance Use Topics  . Alcohol use: Yes    Alcohol/week: 0.0 standard drinks  . Drug use: No     Medication list has been reviewed and updated.  Current  Meds  Medication Sig  . aspirin EC 81 MG tablet Take 81 mg by mouth daily.  . Cholecalciferol (VITAMIN D3) 5000 units CAPS Take 1 capsule by mouth daily.  Marland Kitchen LUMIGAN 0.01 % SOLN Place 1 drop into both eyes at bedtime.  . Multiple Vitamin (MULTIVITAMIN) tablet Take 1 tablet by mouth daily.  . Omega-3 Fatty Acids (FISH OIL CONCENTRATE PO) Take by mouth.    PHQ 2/9 Scores 03/09/2017 03/10/2016  PHQ - 2 Score 0 0    Physical Exam  BP 120/81   Pulse (!) 54   Resp 16   Ht 6\' 3"  (1.905 m)   Wt 184 lb 3.2 oz (83.6 kg)   SpO2 97%   BMI 23.02 kg/m   Assessment and Plan: 1. Sebaceous cyst Patient has a small remnant of a sebaceous cyst on his back from a previous surgery by Dr. Dorna Bloom in [facility].  Patient would like to have this incised and drained we will attempt to get another appointment in [facility] with Dr. Wilma Flavin if she is unavailable we have offered to send  him to local general surgery. - Ambulatory referral to General Surgery  2. Aortic atherosclerosis (HCC) With history of aortic atherosclerosis for which we have suggested low-dose aspirin.  3. Noise-induced hearing loss of both ears She would like to have his decreased hearing evaluated patient has a history of tinnitus we will refer to ENT for evaluation and treatment if necessary. - Ambulatory referral to ENT

## 2018-12-22 DIAGNOSIS — H401221 Low-tension glaucoma, left eye, mild stage: Secondary | ICD-10-CM | POA: Diagnosis not present

## 2018-12-22 DIAGNOSIS — H2513 Age-related nuclear cataract, bilateral: Secondary | ICD-10-CM | POA: Diagnosis not present

## 2018-12-22 DIAGNOSIS — H401212 Low-tension glaucoma, right eye, moderate stage: Secondary | ICD-10-CM | POA: Diagnosis not present

## 2018-12-27 NOTE — Addendum Note (Signed)
Addended by: Everitt Amber on: 12/27/2018 09:18 AM   Modules accepted: Orders

## 2019-01-09 DIAGNOSIS — H9319 Tinnitus, unspecified ear: Secondary | ICD-10-CM | POA: Diagnosis not present

## 2019-01-09 DIAGNOSIS — H903 Sensorineural hearing loss, bilateral: Secondary | ICD-10-CM | POA: Diagnosis not present

## 2019-01-11 ENCOUNTER — Other Ambulatory Visit: Payer: Self-pay

## 2019-01-11 ENCOUNTER — Ambulatory Visit (INDEPENDENT_AMBULATORY_CARE_PROVIDER_SITE_OTHER): Payer: Medicare Other | Admitting: Family Medicine

## 2019-01-11 ENCOUNTER — Encounter: Payer: Self-pay | Admitting: Family Medicine

## 2019-01-11 VITALS — BP 130/70 | HR 64 | Ht 75.0 in | Wt 182.0 lb

## 2019-01-11 DIAGNOSIS — W57XXXA Bitten or stung by nonvenomous insect and other nonvenomous arthropods, initial encounter: Secondary | ICD-10-CM | POA: Diagnosis not present

## 2019-01-11 DIAGNOSIS — S30861A Insect bite (nonvenomous) of abdominal wall, initial encounter: Secondary | ICD-10-CM | POA: Diagnosis not present

## 2019-01-11 DIAGNOSIS — L03311 Cellulitis of abdominal wall: Secondary | ICD-10-CM | POA: Diagnosis not present

## 2019-01-11 MED ORDER — MUPIROCIN 2 % EX OINT: 1.0000 "application " | TOPICAL_OINTMENT | Freq: Two times a day (BID) | CUTANEOUS | 0 refills | Status: DC

## 2019-01-11 MED ORDER — DOXYCYCLINE HYCLATE 100 MG PO TABS: 100.0000 mg | ORAL_TABLET | Freq: Two times a day (BID) | ORAL | 0 refills | Status: DC

## 2019-01-11 NOTE — Progress Notes (Signed)
Date:  01/11/2019   Name:  Patrick Mcknight   DOB:  December 24, 1950   MRN:  [id number]   Chief Complaint: Tick Removal (on L) side)  Patient needs a tick removed.   Review of Systems  Constitutional: Negative for chills and fever.  HENT: Negative for drooling, ear discharge, ear pain and sore throat.   Respiratory: Negative for cough, shortness of breath and wheezing.   Cardiovascular: Negative for chest pain, palpitations and leg swelling.  Gastrointestinal: Negative for abdominal pain, blood in stool, constipation, diarrhea and nausea.  Endocrine: Negative for polydipsia.  Genitourinary: Negative for dysuria, frequency, hematuria and urgency.  Musculoskeletal: Negative for back pain, myalgias and neck pain.  Skin: Negative for rash.  Allergic/Immunologic: Negative for environmental allergies.  Neurological: Negative for dizziness and headaches.  Hematological: Does not bruise/bleed easily.  Psychiatric/Behavioral: Negative for suicidal ideas. The patient is not nervous/anxious.     Patient Active Problem List   Diagnosis Date Noted  . Aortic atherosclerosis (HCC) 12/15/2018  . Glaucoma 03/09/2017  . Diverticulosis 02/14/2015  . Irritable bowel syndrome (IBS) 02/14/2015  . Hx of iron deficiency anemia 02/14/2015  . Hyperlipemia intolerant statins 02/14/2015  . Vitamin D deficiency 02/14/2015  . Erectile dysfunction 02/14/2015  . Ureteral stricture s/p stent 02/14/2015    Allergies  Allergen Reactions  . Crestor [Rosuvastatin]     kidney pain    Past Surgical History:  Procedure Laterality Date  . URETHRA SURGERY  1990's   stent placement    Social History   Tobacco Use  . Smoking status: Never Smoker  . Smokeless tobacco: Never Used  Substance Use Topics  . Alcohol use: Yes    Alcohol/week: 0.0 standard drinks  . Drug use: No     Medication list has been reviewed and updated.  Current Meds  Medication Sig  . aspirin EC 81 MG tablet Take 81 mg by mouth  daily.  . Cholecalciferol (VITAMIN D3) 5000 units CAPS Take 1 capsule by mouth daily.  Marland Kitchen LUMIGAN 0.01 % SOLN Place 1 drop into both eyes at bedtime.  . Multiple Vitamin (MULTIVITAMIN) tablet Take 1 tablet by mouth daily.  . Omega-3 Fatty Acids (FISH OIL CONCENTRATE PO) Take by mouth.    PHQ 2/9 Scores 01/11/2019 03/09/2017 03/10/2016  PHQ - 2 Score 0 0 0  PHQ- 9 Score 0 - -    Physical Exam Vitals signs and nursing note reviewed.  Constitutional:      Appearance: Normal appearance. He is normal weight.  HENT:     Head: Normocephalic.     Right Ear: Tympanic membrane, ear canal and external ear normal.     Left Ear: Tympanic membrane, ear canal and external ear normal.     Nose: Nose normal.  Eyes:     General: No scleral icterus.       Right eye: No discharge.        Left eye: No discharge.     Conjunctiva/sclera: Conjunctivae normal.     Pupils: Pupils are equal, round, and reactive to light.  Neck:     Musculoskeletal: Normal range of motion and neck supple.     Thyroid: No thyromegaly.     Vascular: No JVD.     Trachea: No tracheal deviation.  Cardiovascular:     Rate and Rhythm: Normal rate and regular rhythm.     Heart sounds: Normal heart sounds. No murmur. No friction rub. No gallop.   Pulmonary:     Effort:  No respiratory distress.     Breath sounds: Normal breath sounds. No wheezing or rales.  Abdominal:     General: Bowel sounds are normal.     Palpations: Abdomen is soft. There is no mass.     Tenderness: There is no abdominal tenderness. There is no guarding or rebound.  Musculoskeletal: Normal range of motion.        General: No tenderness.  Lymphadenopathy:     Cervical: No cervical adenopathy.  Skin:    General: Skin is warm.     Findings: No rash.     Comments: Deer tick left lateral torso.  Neurological:     Mental Status: He is alert and oriented to person, place, and time.     Cranial Nerves: No cranial nerve deficit.     Deep Tendon Reflexes:  Reflexes are normal and symmetric.     Wt Readings from Last 3 Encounters:  01/11/19 182 lb (82.6 kg)  12/15/18 184 lb 3.2 oz (83.6 kg)  03/07/18 180 lb (81.6 kg)    BP 130/70   Pulse 64   Ht 6\' 3"  (1.905 m)   Wt 182 lb (82.6 kg)   BMI 22.75 kg/m   Assessment and Plan: 1. Cellulitis of abdominal wall Patient has a cellulitis secondary to a tick bite on the abdominal wall lateral aspect of the left side area was cleaned with Betadine and tick was removed alive from the area area was then dressed with antibiotic ointment.  And was given doxycycline 100 mg twice a day for 7 days for the cellulitis. - mupirocin ointment (BACTROBAN) 2 %; Apply 1 application topically 2 (two) times daily.  Dispense: 22 g; Refill: 0 - doxycycline (VIBRA-TABS) 100 MG tablet; Take 1 tablet (100 mg total) by mouth 2 (two) times daily.  Dispense: 20 tablet; Refill: 0  2. Tick bite of abdominal wall, initial encounter Patient has a tick bite for which the area was prepped and the tick was removed intact. - doxycycline (VIBRA-TABS) 100 MG tablet; Take 1 tablet (100 mg total) by mouth 2 (two) times daily.  Dispense: 20 tablet; Refill: 0

## 2019-03-09 ENCOUNTER — Ambulatory Visit (INDEPENDENT_AMBULATORY_CARE_PROVIDER_SITE_OTHER): Payer: Medicare Other | Admitting: Family Medicine

## 2019-03-09 ENCOUNTER — Other Ambulatory Visit: Payer: Self-pay

## 2019-03-09 ENCOUNTER — Encounter: Payer: Self-pay | Admitting: Family Medicine

## 2019-03-09 VITALS — BP 124/80 | HR 60 | Ht 75.0 in | Wt 184.0 lb

## 2019-03-09 DIAGNOSIS — Z0000 Encounter for general adult medical examination without abnormal findings: Secondary | ICD-10-CM | POA: Diagnosis not present

## 2019-03-09 LAB — POCT URINALYSIS DIPSTICK
Bilirubin, UA: NEGATIVE
Blood, UA: NEGATIVE
Glucose, UA: NEGATIVE
Ketones, UA: NEGATIVE
Leukocytes, UA: NEGATIVE
Nitrite, UA: NEGATIVE
Protein, UA: NEGATIVE
Spec Grav, UA: <=1.005 — AB (ref 1.010–1.025)
Urobilinogen, UA: 0.2 E.U./dL
pH, UA: 7.5 (ref 5.0–8.0)

## 2019-03-09 LAB — HEMOCCULT GUIAC POC 1CARD (OFFICE): Fecal Occult Blood, POC: NEGATIVE

## 2019-03-09 NOTE — Progress Notes (Signed)
Date:  03/09/2019   Name:  Patrick Mcknight   DOB:  04/26/1951   MRN:  [id number]   Chief Complaint: Annual Exam (no concerns)  Patient is a 68 year old male who presents for a comprehensive physical exam. The patient reports the following problems: none. Health maintenance has been reviewed up to date.   Review of Systems  Constitutional: Negative for chills and fever.  HENT: Negative for drooling, ear discharge, ear pain and sore throat.   Respiratory: Negative for cough, shortness of breath and wheezing.   Cardiovascular: Negative for chest pain, palpitations and leg swelling.  Gastrointestinal: Negative for abdominal pain, blood in stool, constipation, diarrhea and nausea.  Endocrine: Negative for polydipsia.  Genitourinary: Negative for dysuria, frequency, hematuria and urgency.  Musculoskeletal: Negative for back pain, myalgias and neck pain.  Skin: Negative for rash.  Allergic/Immunologic: Negative for environmental allergies.  Neurological: Negative for dizziness and headaches.  Hematological: Does not bruise/bleed easily.  Psychiatric/Behavioral: Negative for suicidal ideas. The patient is not nervous/anxious.     Patient Active Problem List   Diagnosis Date Noted  . Aortic atherosclerosis (HCC) 12/15/2018  . Glaucoma 03/09/2017  . Diverticulosis 02/14/2015  . Irritable bowel syndrome (IBS) 02/14/2015  . Hx of iron deficiency anemia 02/14/2015  . Hyperlipemia intolerant statins 02/14/2015  . Vitamin D deficiency 02/14/2015  . Erectile dysfunction 02/14/2015  . Ureteral stricture s/p stent 02/14/2015    Allergies  Allergen Reactions  . Crestor [Rosuvastatin]     kidney pain    Past Surgical History:  Procedure Laterality Date  . URETHRA SURGERY  1990's   stent placement    Social History   Tobacco Use  . Smoking status: Never Smoker  . Smokeless tobacco: Never Used  Substance Use Topics  . Alcohol use: Yes    Alcohol/week: 0.0 standard drinks  .  Drug use: No     Medication list has been reviewed and updated.  Current Meds  Medication Sig  . aspirin EC 81 MG tablet Take 81 mg by mouth daily.  . Cholecalciferol (VITAMIN D3) 5000 units CAPS Take 1 capsule by mouth daily.  Marland Kitchen LUMIGAN 0.01 % SOLN Place 1 drop into both eyes at bedtime.  . Multiple Vitamin (MULTIVITAMIN) tablet Take 1 tablet by mouth daily.  . mupirocin ointment (BACTROBAN) 2 % Apply 1 application topically 2 (two) times daily.  . Omega-3 Fatty Acids (FISH OIL CONCENTRATE PO) Take by mouth.  . [DISCONTINUED] doxycycline (VIBRA-TABS) 100 MG tablet Take 1 tablet (100 mg total) by mouth 2 (two) times daily.    PHQ 2/9 Scores 03/09/2019 01/11/2019 03/09/2017 03/10/2016  PHQ - 2 Score 0 0 0 0  PHQ- 9 Score 0 0 - -    BP Readings from Last 3 Encounters:  03/09/19 124/80  01/11/19 130/70  12/15/18 120/81    Physical Exam Vitals signs and nursing note reviewed.  Constitutional:      General: He is not in acute distress.    Appearance: Normal appearance. He is well-developed, well-groomed and normal weight.  HENT:     Head: Normocephalic.     Jaw: There is normal jaw occlusion.     Right Ear: Hearing, tympanic membrane, ear canal and external ear normal.     Left Ear: Hearing, tympanic membrane, ear canal and external ear normal.     Nose: Nose normal. No nasal deformity.     Mouth/Throat:     Lips: Pink.     Mouth: Mucous membranes  are dry.     Dentition: Normal dentition.     Tongue: No lesions.     Palate: No mass and lesions.     Pharynx: Oropharynx is clear.     Tonsils: No tonsillar exudate or tonsillar abscesses.  Eyes:     General: Lids are normal. Lids are everted, no foreign bodies appreciated. Vision grossly intact. Gaze aligned appropriately. No scleral icterus.       Right eye: No discharge.        Left eye: No discharge.     Conjunctiva/sclera: Conjunctivae normal.     Pupils: Pupils are equal, round, and reactive to light.     Funduscopic  exam:    Right eye: No AV nicking.        Left eye: No AV nicking.  Neck:     Musculoskeletal: Full passive range of motion without pain, normal range of motion and neck supple.     Thyroid: No thyroid mass, thyromegaly or thyroid tenderness.     Vascular: Normal carotid pulses. No carotid bruit, hepatojugular reflux or JVD.     Trachea: Trachea and phonation normal. No tracheal deviation.  Cardiovascular:     Rate and Rhythm: Normal rate and regular rhythm.     Chest Wall: PMI is not displaced. No thrill.     Pulses: Normal pulses.          Carotid pulses are 2+ on the right side and 2+ on the left side.      Radial pulses are 2+ on the right side and 2+ on the left side.       Femoral pulses are 2+ on the right side and 2+ on the left side.      Popliteal pulses are 2+ on the right side and 2+ on the left side.       Dorsalis pedis pulses are 2+ on the right side and 2+ on the left side.       Posterior tibial pulses are 2+ on the right side and 2+ on the left side.     Heart sounds: Normal heart sounds and S1 normal. No murmur. No systolic murmur. No diastolic murmur. No friction rub. No gallop. No S3 or S4 sounds.   Pulmonary:     Effort: No respiratory distress.     Breath sounds: Normal breath sounds. No decreased breath sounds, wheezing, rhonchi or rales.  Chest:     Chest wall: No mass or deformity.     Breasts: Breasts are symmetrical.        Right: Normal. No mass.        Left: Normal. No mass.  Abdominal:     General: Abdomen is flat. Bowel sounds are normal. There is no distension or abdominal bruit.     Palpations: Abdomen is soft. There is no mass.     Tenderness: There is no abdominal tenderness. There is no right CVA tenderness, left CVA tenderness, guarding or rebound.  Genitourinary:    Penis: Normal.      Scrotum/Testes: Normal.        Right: Mass, testicular hydrocele or varicocele not present.        Left: Mass, testicular hydrocele or varicocele not present.      Prostate: Normal. Not enlarged, not tender and no nodules present.     Rectum: Normal. Guaiac result negative. No mass.    Musculoskeletal: Normal range of motion.        General: No tenderness.  Cervical back: Normal.     Thoracic back: Normal.     Lumbar back: Normal.     Right lower leg: No edema.     Left lower leg: No edema.     Right foot: Normal range of motion.     Left foot: Normal range of motion.  Feet:     Right foot:     Skin integrity: Skin integrity normal.     Left foot:     Skin integrity: Skin integrity normal.  Lymphadenopathy:     Head:     Right side of head: No submandibular adenopathy.     Left side of head: No submandibular adenopathy.     Cervical: No cervical adenopathy.  Skin:    General: Skin is warm.     Findings: No rash.          Comments: Sebaceous cyst  Neurological:     Mental Status: He is alert and oriented to person, place, and time.     Cranial Nerves: Cranial nerves are intact. No cranial nerve deficit.     Sensory: Sensation is intact.     Motor: Motor function is intact.     Deep Tendon Reflexes: Reflexes are normal and symmetric.     Reflex Scores:      Tricep reflexes are 2+ on the right side and 2+ on the left side.      Bicep reflexes are 2+ on the right side and 2+ on the left side.      Brachioradialis reflexes are 2+ on the right side and 2+ on the left side.      Patellar reflexes are 2+ on the right side and 2+ on the left side.      Achilles reflexes are 2+ on the right side and 2+ on the left side. Psychiatric:        Behavior: Behavior is cooperative.     Wt Readings from Last 3 Encounters:  03/09/19 184 lb (83.5 kg)  01/11/19 182 lb (82.6 kg)  12/15/18 184 lb 3.2 oz (83.6 kg)    BP 124/80   Pulse 60   Ht 6\' 3"  (1.905 m)   Wt 184 lb (83.5 kg)   BMI 23.00 kg/m   Assessment and Plan: 1. Annual physical exam No subjective/objective concerns noted during the history and physical exam.  Chart review of  patient was done as positive review of labs, imaging, and specialty notes.  Renal function panel lipid panel done patient has his urology prostate followed by Dr. Sheppard PentonWolf.  Colon cancer screening was negative for blood analysis was negative.  Patient has a history of atherosclerosis for which we will check lipid and adjust with diet accordingly. - Renal Function Panel - Lipid panel - POCT occult blood stool - POCT urinalysis dipstick

## 2019-03-15 DIAGNOSIS — Z0000 Encounter for general adult medical examination without abnormal findings: Secondary | ICD-10-CM | POA: Diagnosis not present

## 2019-03-16 LAB — RENAL FUNCTION PANEL
Albumin: 4 g/dL (ref 3.8–4.8)
BUN/Creatinine Ratio: 11 (ref 10–24)
BUN: 12 mg/dL (ref 8–27)
CO2: 25 mmol/L (ref 20–29)
Calcium: 9.1 mg/dL (ref 8.6–10.2)
Chloride: 102 mmol/L (ref 96–106)
Creatinine, Ser: 1.14 mg/dL (ref 0.76–1.27)
GFR calc Af Amer: 76 mL/min/{1.73_m2} (ref >59)
GFR calc non Af Amer: 66 mL/min/{1.73_m2} (ref >59)
Glucose: 86 mg/dL (ref 65–99)
Phosphorus: 3.2 mg/dL (ref 2.8–4.1)
Potassium: 4.6 mmol/L (ref 3.5–5.2)
Sodium: 137 mmol/L (ref 134–144)

## 2019-03-16 LAB — LIPID PANEL
Chol/HDL Ratio: 4.3 ratio (ref 0.0–5.0)
Cholesterol, Total: 234 mg/dL — ABNORMAL HIGH (ref 100–199)
HDL: 55 mg/dL (ref >39)
LDL Calculated: 158 mg/dL — ABNORMAL HIGH (ref 0–99)
Triglycerides: 105 mg/dL (ref 0–149)
VLDL Cholesterol Cal: 21 mg/dL (ref 5–40)

## 2019-03-22 DIAGNOSIS — H2513 Age-related nuclear cataract, bilateral: Secondary | ICD-10-CM | POA: Diagnosis not present

## 2019-03-22 DIAGNOSIS — H401212 Low-tension glaucoma, right eye, moderate stage: Secondary | ICD-10-CM | POA: Diagnosis not present

## 2019-03-22 DIAGNOSIS — H401221 Low-tension glaucoma, left eye, mild stage: Secondary | ICD-10-CM | POA: Diagnosis not present

## 2019-03-23 DIAGNOSIS — L988 Other specified disorders of the skin and subcutaneous tissue: Secondary | ICD-10-CM | POA: Diagnosis not present

## 2019-03-23 DIAGNOSIS — L728 Other follicular cysts of the skin and subcutaneous tissue: Secondary | ICD-10-CM | POA: Diagnosis not present

## 2019-03-23 DIAGNOSIS — L720 Epidermal cyst: Secondary | ICD-10-CM | POA: Diagnosis not present

## 2019-04-03 ENCOUNTER — Telehealth: Payer: Self-pay | Admitting: *Deleted

## 2019-04-03 ENCOUNTER — Other Ambulatory Visit: Payer: Self-pay

## 2019-04-03 ENCOUNTER — Ambulatory Visit (INDEPENDENT_AMBULATORY_CARE_PROVIDER_SITE_OTHER): Payer: Medicare Other | Admitting: Family Medicine

## 2019-04-03 ENCOUNTER — Encounter: Payer: Self-pay | Admitting: Family Medicine

## 2019-04-03 VITALS — BP 120/70 | HR 72 | Temp 99.3°F | Ht 75.0 in | Wt 179.0 lb

## 2019-04-03 DIAGNOSIS — Z20822 Contact with and (suspected) exposure to covid-19: Secondary | ICD-10-CM

## 2019-04-03 DIAGNOSIS — R509 Fever, unspecified: Secondary | ICD-10-CM | POA: Diagnosis not present

## 2019-04-03 NOTE — Progress Notes (Addendum)
Date:  04/03/2019   Name:  Patrick Mcknight   DOB:  10-05-1951   MRN:  [id number]   Chief Complaint: Night Sweats (started Thursday into Friday night. "was dragging Saturday")  I connected withthis patient, Patrick Mcknight, by telephoneat the physician's office.  I verified that I am speaking with the correct person using two identifiers. This visit was conducted via telephone due to the Covid-19 outbreak from my office at [facility] in [place], [place]. I discussed the limitations, risks, security and privacy concerns of performing an evaluation and management service by telephone. I also discussed with the patient that there may be a patient responsible charge related to this service. The patient expressed understanding and agreed to proceed.  Fever   This is a new problem. The current episode started in the past 7 days (4 day). The problem occurs intermittently. The problem has been waxing and waning. The maximum temperature noted was 99 to 99.9 F. Pertinent negatives include no abdominal pain, chest pain, congestion, coughing, diarrhea, ear pain, headaches, muscle aches, nausea, rash, sleepiness, sore throat, urinary pain, vomiting or wheezing. He has tried NSAIDs for the symptoms. The treatment provided moderate relief.  Risk factors: no recent sickness     Review of Systems  Constitutional: Positive for diaphoresis, fatigue and fever. Negative for chills.  HENT: Negative for congestion, drooling, ear discharge, ear pain, postnasal drip, sinus pressure, sinus pain and sore throat.   Respiratory: Negative for cough, shortness of breath and wheezing.   Cardiovascular: Negative for chest pain, palpitations and leg swelling.  Gastrointestinal: Negative for abdominal pain, blood in stool, constipation, diarrhea, nausea and vomiting.  Endocrine: Negative for polydipsia.  Genitourinary: Negative for difficulty urinating, dysuria, frequency, hematuria and urgency.  Musculoskeletal: Negative  for back pain, myalgias and neck pain.  Skin: Negative for rash.  Allergic/Immunologic: Negative for environmental allergies.  Neurological: Negative for dizziness and headaches.  Hematological: Does not bruise/bleed easily.  Psychiatric/Behavioral: Negative for suicidal ideas. The patient is not nervous/anxious.     Patient Active Problem List   Diagnosis Date Noted  . Aortic atherosclerosis (HCC) 12/15/2018  . Glaucoma 03/09/2017  . Diverticulosis 02/14/2015  . Irritable bowel syndrome (IBS) 02/14/2015  . Hx of iron deficiency anemia 02/14/2015  . Hyperlipemia intolerant statins 02/14/2015  . Vitamin D deficiency 02/14/2015  . Erectile dysfunction 02/14/2015  . Ureteral stricture s/p stent 02/14/2015    Allergies  Allergen Reactions  . Crestor [Rosuvastatin]     kidney pain    Past Surgical History:  Procedure Laterality Date  . URETHRA SURGERY  1990's   stent placement    Social History   Tobacco Use  . Smoking status: Never Smoker  . Smokeless tobacco: Never Used  Substance Use Topics  . Alcohol use: Yes    Alcohol/week: 0.0 standard drinks  . Drug use: No     Medication list has been reviewed and updated.  Current Meds  Medication Sig  . aspirin EC 81 MG tablet Take 81 mg by mouth daily.  . Cholecalciferol (VITAMIN D3) 5000 units CAPS Take 1 capsule by mouth daily.  Marland Kitchen. LUMIGAN 0.01 % SOLN Place 1 drop into both eyes at bedtime.  . Multiple Vitamin (MULTIVITAMIN) tablet Take 1 tablet by mouth daily.  . mupirocin ointment (BACTROBAN) 2 % Apply 1 application topically 2 (two) times daily.  . Omega-3 Fatty Acids (FISH OIL CONCENTRATE PO) Take by mouth.    PHQ 2/9 Scores 03/09/2019 01/11/2019 03/09/2017 03/10/2016  PHQ -  2 Score 0 0 0 0  PHQ- 9 Score 0 0 - -    BP Readings from Last 3 Encounters:  04/03/19 120/70  03/09/19 124/80  01/11/19 130/70    Physical Exam Vitals signs and nursing note reviewed.  HENT:     Head: Normocephalic.     Right Ear:  Tympanic membrane, ear canal and external ear normal.     Left Ear: Tympanic membrane, ear canal and external ear normal.     Nose: Nose normal.     Mouth/Throat:     Mouth: Mucous membranes are moist.     Pharynx: Oropharynx is clear. No pharyngeal swelling, oropharyngeal exudate or posterior oropharyngeal erythema.     Tonsils: No tonsillar exudate or tonsillar abscesses.  Eyes:     General: No scleral icterus.       Right eye: No discharge.        Left eye: No discharge.     Conjunctiva/sclera: Conjunctivae normal.     Pupils: Pupils are equal, round, and reactive to light.  Neck:     Musculoskeletal: Normal range of motion and neck supple.     Thyroid: No thyromegaly.     Vascular: No JVD.     Trachea: No tracheal deviation.  Cardiovascular:     Rate and Rhythm: Normal rate and regular rhythm.     Heart sounds: Normal heart sounds. No murmur. No friction rub. No gallop.   Pulmonary:     Effort: No respiratory distress or retractions.     Breath sounds: Normal breath sounds. No decreased breath sounds, wheezing, rhonchi or rales.  Abdominal:     General: Bowel sounds are normal.     Palpations: Abdomen is soft. There is no mass.     Tenderness: There is no abdominal tenderness. There is no guarding or rebound.  Musculoskeletal: Normal range of motion.        General: No tenderness.  Lymphadenopathy:     Head:     Right side of head: No submandibular adenopathy.     Left side of head: No submandibular adenopathy.     Cervical: No cervical adenopathy.     Right cervical: No superficial, deep or posterior cervical adenopathy.    Left cervical: No superficial, deep or posterior cervical adenopathy.  Skin:    General: Skin is warm.     Findings: No rash.     Comments: Wound healing well  Neurological:     Mental Status: He is alert and oriented to person, place, and time.     Cranial Nerves: No cranial nerve deficit.     Deep Tendon Reflexes: Reflexes are normal and  symmetric.     Wt Readings from Last 3 Encounters:  04/03/19 179 lb (81.2 kg)  03/09/19 184 lb (83.5 kg)  01/11/19 182 lb (82.6 kg)    BP 120/70   Pulse 72   Temp 99.3 F (37.4 C) (Oral)   Ht 6\' 3"  (1.905 m)   Wt 179 lb (81.2 kg)   BMI 22.37 kg/m   Assessment and Plan:  1. Fever and chills Patient with fever and chills and diaphoresis for 4 days.  Patient is also felt fatigued over the weekend but that seems to be improving somewhat at this time.  Exam notes no bacterial source.  We will place patient on COVID protocol and patient information was submitted to [facility] for [facility] COVID testing. I spent 10 minutes with this patient, More than 50% of that time  was spent in face to face education, counseling and care coordination.

## 2019-04-03 NOTE — Telephone Encounter (Signed)
Pt called and left message on cell phone voicemail to return call to schedule COVID-19 testing.   Pt called on home number and scheduled for testing at [facility] site on 04/04/19 at 2:00 pm. Pt advised to wear a mask and to remain in car for appt. Understanding verbalized.

## 2019-04-03 NOTE — Telephone Encounter (Signed)
-----   Message from Juline Patch, MD sent at 04/03/2019  4:42 PM EDT ----- Regarding: covid testing Fever/chills/diaphoresis/weekend fatigue for 4 days

## 2019-04-04 ENCOUNTER — Other Ambulatory Visit: Payer: Medicare Other

## 2019-04-04 ENCOUNTER — Other Ambulatory Visit: Payer: Self-pay | Admitting: Internal Medicine

## 2019-04-04 DIAGNOSIS — Z20822 Contact with and (suspected) exposure to covid-19: Secondary | ICD-10-CM

## 2019-04-04 DIAGNOSIS — R6889 Other general symptoms and signs: Secondary | ICD-10-CM | POA: Diagnosis not present

## 2019-04-10 ENCOUNTER — Telehealth: Payer: Self-pay | Admitting: General Practice

## 2019-04-10 NOTE — Telephone Encounter (Signed)
-----   Message from Fredderick Severance sent at 04/10/2019 11:29 AM EDT ----- Pt was sent last Monday for testing. We asked pt if he went, he stated yes. We have not seen his results. Can you please check on this

## 2019-04-10 NOTE — Telephone Encounter (Deleted)
-----   Message from Tara L Lynch sent at 04/10/2019 11:29 AM EDT ----- °Pt was sent last Monday for testing. We asked pt if he went, he stated yes. We have not seen his results. Can you please check on this ° °

## 2019-04-10 NOTE — Telephone Encounter (Signed)
Hello, unfortunately there has been a delay in receiving results. The [facility] is responsible for ordering the test and [facility] is responsible for sending the results.   Please contact [facility] for results for this pt   Thanks

## 2019-04-14 ENCOUNTER — Other Ambulatory Visit: Payer: Self-pay

## 2019-04-14 ENCOUNTER — Ambulatory Visit
Admission: RE | Admit: 2019-04-14 | Discharge: 2019-04-14 | Disposition: A | Discharge status: Home / Self Care | Payer: Medicare Other | Attending: Family Medicine | Admitting: Family Medicine

## 2019-04-14 ENCOUNTER — Ambulatory Visit (INDEPENDENT_AMBULATORY_CARE_PROVIDER_SITE_OTHER): Payer: Medicare Other | Admitting: Family Medicine

## 2019-04-14 ENCOUNTER — Ambulatory Visit
Admission: RE | Admit: 2019-04-14 | Discharge: 2019-04-14 | Disposition: A | Discharge status: Home / Self Care | Payer: Medicare Other | Source: Ambulatory Visit | Attending: Family Medicine | Admitting: Family Medicine

## 2019-04-14 ENCOUNTER — Encounter: Payer: Self-pay | Admitting: Family Medicine

## 2019-04-14 VITALS — BP 120/60 | HR 60 | Ht 75.0 in | Wt 182.0 lb

## 2019-04-14 DIAGNOSIS — Z9289 Personal history of other medical treatment: Secondary | ICD-10-CM | POA: Insufficient documentation

## 2019-04-14 DIAGNOSIS — R509 Fever, unspecified: Secondary | ICD-10-CM

## 2019-04-14 DIAGNOSIS — R61 Generalized hyperhidrosis: Secondary | ICD-10-CM

## 2019-04-14 NOTE — Progress Notes (Signed)
Date:  04/14/2019   Name:  Patrick Mcknight   DOB:  15-Jan-1951   MRN:  [id number]   Chief Complaint: Night Sweats (occurs in the first half of the night, then subsides/ "will have a positive ppd test')  Tb /positive ppd/ treated health department [place] on medication for a year.  Thyroid Problem Presents for initial (night sweats) visit. The condition has lasted for 3 weeks. Symptoms include cold intolerance and diaphoresis. Patient reports no anxiety, constipation, depressed mood, diarrhea, dry skin, fatigue, hair loss, heat intolerance, hoarse voice, leg swelling, nail problem, palpitations, tremors, visual change, weight gain or weight loss. The symptoms have been stable. There is no history of diabetes.    Review of Systems  Constitutional: Positive for diaphoresis and fever. Negative for chills, fatigue, weight gain and weight loss.       Covid ruled out  HENT: Negative for drooling, ear discharge, ear pain, hoarse voice, postnasal drip, rhinorrhea, sinus pain and sore throat.   Respiratory: Negative for cough, shortness of breath and wheezing.   Cardiovascular: Negative for chest pain, palpitations and leg swelling.  Gastrointestinal: Negative for abdominal pain, blood in stool, constipation, diarrhea and nausea.  Endocrine: Positive for cold intolerance. Negative for heat intolerance and polydipsia.  Genitourinary: Negative for dysuria, frequency, hematuria and urgency.  Musculoskeletal: Negative for back pain, myalgias and neck pain.  Skin: Negative for rash.  Allergic/Immunologic: Negative for environmental allergies.  Neurological: Negative for dizziness, tremors and headaches.  Hematological: Does not bruise/bleed easily.  Psychiatric/Behavioral: Negative for suicidal ideas. The patient is not nervous/anxious.     Patient Active Problem List   Diagnosis Date Noted  . Aortic atherosclerosis ([place]) 12/15/2018  . Glaucoma 03/09/2017  . Diverticulosis 02/14/2015  .  Irritable bowel syndrome (IBS) 02/14/2015  . Hx of iron deficiency anemia 02/14/2015  . Hyperlipemia intolerant statins 02/14/2015  . Vitamin D deficiency 02/14/2015  . Erectile dysfunction 02/14/2015  . Ureteral stricture s/p stent 02/14/2015    Allergies  Allergen Reactions  . Crestor [Rosuvastatin]     kidney pain    Past Surgical History:  Procedure Laterality Date  . URETHRA SURGERY  1990's   stent placement    Social History   Tobacco Use  . Smoking status: Never Smoker  . Smokeless tobacco: Never Used  Substance Use Topics  . Alcohol use: Yes    Alcohol/week: 0.0 standard drinks  . Drug use: No     Medication list has been reviewed and updated.  Current Meds  Medication Sig  . aspirin EC 81 MG tablet Take 81 mg by mouth daily.  . Cholecalciferol (VITAMIN D3) 5000 units CAPS Take 1 capsule by mouth daily.  Marland Kitchen LUMIGAN 0.01 % SOLN Place 1 drop into both eyes at bedtime.  . Multiple Vitamin (MULTIVITAMIN) tablet Take 1 tablet by mouth daily.  . mupirocin ointment (BACTROBAN) 2 % Apply 1 application topically 2 (two) times daily.  . Omega-3 Fatty Acids (FISH OIL CONCENTRATE PO) Take by mouth.    PHQ 2/9 Scores 03/09/2019 01/11/2019 03/09/2017 03/10/2016  PHQ - 2 Score 0 0 0 0  PHQ- 9 Score 0 0 - -    BP Readings from Last 3 Encounters:  04/14/19 120/60  04/03/19 120/70  03/09/19 124/80    Physical Exam Vitals signs and nursing note reviewed.  HENT:     Head: Normocephalic.     Jaw: There is normal jaw occlusion.     Right Ear: Tympanic membrane, ear canal and  external ear normal.     Left Ear: Tympanic membrane, ear canal and external ear normal.     Nose: Nose normal.     Mouth/Throat:     Lips: Pink.     Mouth: Mucous membranes are moist.     Pharynx: Oropharynx is clear. Uvula midline. No pharyngeal swelling, oropharyngeal exudate, posterior oropharyngeal erythema or uvula swelling.  Eyes:     General: Lids are normal. Vision grossly intact. No  scleral icterus.       Right eye: No discharge.        Left eye: No discharge.     Conjunctiva/sclera: Conjunctivae normal.     Pupils: Pupils are equal, round, and reactive to light.  Neck:     Musculoskeletal: Normal range of motion and neck supple.     Thyroid: No thyroid mass, thyromegaly or thyroid tenderness.     Vascular: No JVD.     Trachea: No tracheal deviation.  Cardiovascular:     Rate and Rhythm: Normal rate and regular rhythm.     Chest Wall: PMI is not displaced.     Pulses: Normal pulses. No decreased pulses.     Heart sounds: Normal heart sounds, S1 normal and S2 normal. No murmur. No systolic murmur. No diastolic murmur. No friction rub. No gallop. No S3 or S4 sounds.   Pulmonary:     Effort: No respiratory distress.     Breath sounds: Normal breath sounds. No decreased breath sounds, wheezing, rhonchi or rales.  Abdominal:     General: Bowel sounds are normal.     Palpations: Abdomen is soft. There is no shifting dullness, hepatomegaly, splenomegaly or mass.     Tenderness: There is no abdominal tenderness. There is no guarding or rebound.  Genitourinary:    Prostate: Normal. Not enlarged, not tender and no nodules present.  Musculoskeletal: Normal range of motion.        General: No tenderness.  Lymphadenopathy:     Head:     Right side of head: No submental, submandibular, tonsillar, preauricular, posterior auricular or occipital adenopathy.     Left side of head: No submental, submandibular, tonsillar, preauricular, posterior auricular or occipital adenopathy.     Cervical: No cervical adenopathy.     Right cervical: No superficial, deep or posterior cervical adenopathy.    Left cervical: No superficial, deep or posterior cervical adenopathy.     Upper Body:     Right upper body: No supraclavicular or axillary adenopathy.     Left upper body: No supraclavicular or axillary adenopathy.     Lower Body: No right inguinal adenopathy. No left inguinal adenopathy.   Skin:    General: Skin is warm.     Findings: No rash.  Neurological:     Mental Status: He is alert and oriented to person, place, and time.     Cranial Nerves: No cranial nerve deficit.     Deep Tendon Reflexes: Reflexes are normal and symmetric.     Wt Readings from Last 3 Encounters:  04/14/19 182 lb (82.6 kg)  04/03/19 179 lb (81.2 kg)  03/09/19 184 lb (83.5 kg)    BP 120/60   Pulse 60   Ht 6\' 3"  (1.905 m)   Wt 182 lb (82.6 kg)   BMI 22.75 kg/m   Assessment and Plan: 1. Night sweats Fairly extensive history was taken.  [facility] upon return [facility] [employer] [facility] was noted to have a positive PPD for which he received treatment for a  approximate year [facility].  This is an unknown medication the patient could not remember the name and there is no information from the 70s available at this time.  In the meantime patient has had multi week history of night sweats previously associated with a fever see evaluation below.  Patient will have the following lab work CBC rule out leukemia myeloproliferative disease, sed rate rule out autoimmune concern TSH rule out hyper thyroidism., hepatic function panel rule out hepatitis.  Testosterone level to rule out menopausal concern, and PSA to rule out static concern.  Patient also will have a Lyme titer done because he is recently pulled a deer tick off of him although he was treated with doxycycline in March we will check his body level.  We will do a chest x-ray to rule out any lymphadenopathy or Vidal Schwalbeeri process such as fungal disease. - CBC with Differential/Platelet - TSH - Hepatic Function Panel (6) - PSA - Testosterone,Free and Total - DG Chest 2 View; Future - Lyme Ab/Western Blot Reflex - Sedimentation rate  2. Fever, unspecified fever cause Patient was noted to have a fever on previous occasions for which he underwent a COVID testing which was noted to be negative. - CBC with Differential/Platelet - PSA - DG Chest 2  View; Future  3. History of TB skin testing As previously mentioned patient had a history of a positive TB skin test upon [facility].  Patient was [facility] with a medication that [facility].  Pending chest x-ray we will notify department to see if QuantiFERON gold needs to be drawn and if so where this will be done. - DG Chest 2 View; Future  I spent 40 minutes with this patient, More than 50% of that time was spent in face to face education, counseling and care coordination.

## 2019-04-17 ENCOUNTER — Other Ambulatory Visit: Payer: Self-pay

## 2019-04-17 DIAGNOSIS — J189 Pneumonia, unspecified organism: Secondary | ICD-10-CM

## 2019-04-17 LAB — SPECIMEN STATUS REPORT

## 2019-04-17 LAB — NOVEL CORONAVIRUS, NAA: SARS-CoV-2, NAA: NOT DETECTED

## 2019-04-17 MED ORDER — AZITHROMYCIN 250 MG PO TABS: ORAL_TABLET | ORAL | 0 refills | Status: DC

## 2019-04-17 NOTE — Progress Notes (Unsigned)
Sent in [employer] 

## 2019-04-18 LAB — CBC WITH DIFFERENTIAL/PLATELET
Basophils Absolute: 0 10*3/uL (ref 0.0–0.2)
Basos: 1 %
EOS (ABSOLUTE): 0.3 10*3/uL (ref 0.0–0.4)
Eos: 4 %
Hematocrit: 34 % — ABNORMAL LOW (ref 37.5–51.0)
Hemoglobin: 11.6 g/dL — ABNORMAL LOW (ref 13.0–17.7)
Immature Grans (Abs): 0.1 10*3/uL (ref 0.0–0.1)
Immature Granulocytes: 1 %
Lymphocytes Absolute: 1.8 10*3/uL (ref 0.7–3.1)
Lymphs: 28 %
MCH: 30.8 pg (ref 26.6–33.0)
MCHC: 34.1 g/dL (ref 31.5–35.7)
MCV: 90 fL (ref 79–97)
Monocytes Absolute: 0.4 10*3/uL (ref 0.1–0.9)
Monocytes: 7 %
Neutrophils Absolute: 3.8 10*3/uL (ref 1.4–7.0)
Neutrophils: 59 %
Platelets: 506 10*3/uL — ABNORMAL HIGH (ref 150–450)
RBC: 3.77 x10E6/uL — ABNORMAL LOW (ref 4.14–5.80)
RDW: 12.2 % (ref 11.6–15.4)
WBC: 6.4 10*3/uL (ref 3.4–10.8)

## 2019-04-18 LAB — HEPATIC FUNCTION PANEL (6)
ALT: 38 IU/L (ref 0–44)
AST: 31 IU/L (ref 0–40)
Albumin: 3.6 g/dL — ABNORMAL LOW (ref 3.8–4.8)
Alkaline Phosphatase: 59 IU/L (ref 39–117)
Bilirubin Total: 0.3 mg/dL (ref 0.0–1.2)
Bilirubin, Direct: 0.11 mg/dL (ref 0.00–0.40)

## 2019-04-18 LAB — TSH: TSH: 1.01 u[IU]/mL (ref 0.450–4.500)

## 2019-04-18 LAB — PSA: Prostate Specific Ag, Serum: 0.5 ng/mL (ref 0.0–4.0)

## 2019-04-18 LAB — TESTOSTERONE,FREE AND TOTAL
Testosterone, Free: 9.1 pg/mL (ref 6.6–18.1)
Testosterone: 442 ng/dL (ref 264–916)

## 2019-04-18 LAB — SEDIMENTATION RATE: Sed Rate: 16 mm/hr (ref 0–30)

## 2019-04-18 LAB — LYME AB/WESTERN BLOT REFLEX
LYME DISEASE AB, QUANT, IGM: <0.8 index (ref 0.00–0.79)
Lyme IgG/IgM Ab: <0.91 {ISR} (ref 0.00–0.90)

## 2019-05-08 ENCOUNTER — Other Ambulatory Visit: Payer: Self-pay

## 2019-05-08 ENCOUNTER — Other Ambulatory Visit (INDEPENDENT_AMBULATORY_CARE_PROVIDER_SITE_OTHER): Payer: Medicare Other

## 2019-05-08 DIAGNOSIS — R509 Fever, unspecified: Secondary | ICD-10-CM | POA: Diagnosis not present

## 2019-05-08 DIAGNOSIS — Z1211 Encounter for screening for malignant neoplasm of colon: Secondary | ICD-10-CM | POA: Diagnosis not present

## 2019-05-08 LAB — HEMOCCULT GUIAC POC 1CARD (OFFICE)
Card #2 Fecal Occult Blod, POC: NEGATIVE
Card #3 Fecal Occult Blood, POC: NEGATIVE
Fecal Occult Blood, POC: NEGATIVE

## 2019-05-10 DIAGNOSIS — M2011 Hallux valgus (acquired), right foot: Secondary | ICD-10-CM | POA: Diagnosis not present

## 2019-05-10 DIAGNOSIS — M19071 Primary osteoarthritis, right ankle and foot: Secondary | ICD-10-CM | POA: Diagnosis not present

## 2019-06-22 DIAGNOSIS — H401212 Low-tension glaucoma, right eye, moderate stage: Secondary | ICD-10-CM | POA: Diagnosis not present

## 2019-06-22 DIAGNOSIS — H401221 Low-tension glaucoma, left eye, mild stage: Secondary | ICD-10-CM | POA: Diagnosis not present

## 2019-06-22 DIAGNOSIS — H2513 Age-related nuclear cataract, bilateral: Secondary | ICD-10-CM | POA: Diagnosis not present

## 2019-09-27 DIAGNOSIS — H2513 Age-related nuclear cataract, bilateral: Secondary | ICD-10-CM | POA: Diagnosis not present

## 2019-09-27 DIAGNOSIS — H401221 Low-tension glaucoma, left eye, mild stage: Secondary | ICD-10-CM | POA: Diagnosis not present

## 2019-09-27 DIAGNOSIS — H401212 Low-tension glaucoma, right eye, moderate stage: Secondary | ICD-10-CM | POA: Diagnosis not present

## 2019-11-09 IMAGING — CT CT ABD-PEL WO/W CM
2 of 9 series · 12 of 46 positions shown, 14 images · IV contrast (iopamidol)
Comparison: CT urogram 09/13/2008

CLINICAL DATA: Left flank pain with intermittent left abdominal
pain for 6 months. History of kidney stones. History of left
ureteral surgery in 9550. No history of malignancy.

EXAM:
CT ABDOMEN AND PELVIS WITHOUT AND WITH CONTRAST
TECHNIQUE: Multidetector CT imaging of the abdomen and pelvis was performed
following the standard protocol before and following the bolus
administration of intravenous contrast.
CONTRAST:  100mL 7BPAMI-OQQ IOPAMIDOL (7BPAMI-OQQ) INJECTION 61%

[Series 4: abd pelvis pre · coronal · non-contrast · 0.74mm/px · 3 of 142 slices shown]
[im 36/142  soft-tissue]
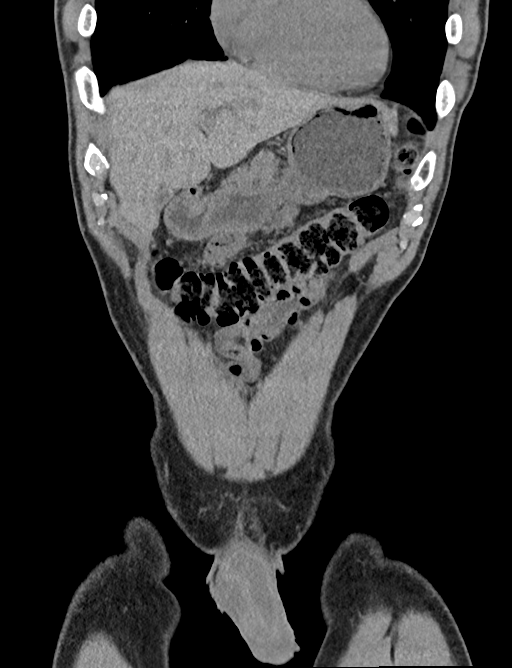
[im 71/142  soft-tissue]
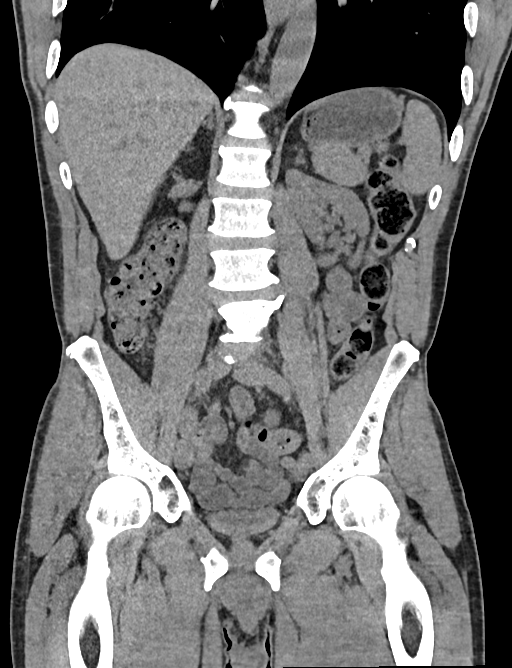
[im 106/142  soft-tissue]
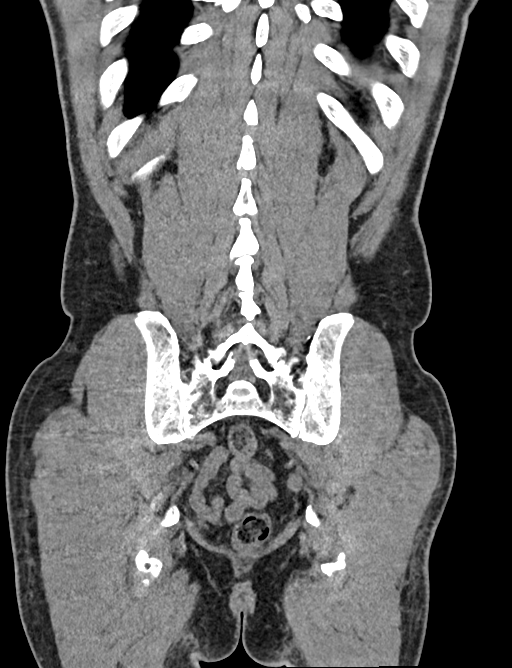

[Series 9: abd pelvis post · axial · 0.76mm/px · z∈[-1547,-1157]mm · 9 of 98 slices shown, 11 images]
[im 10/98  soft-tissue]
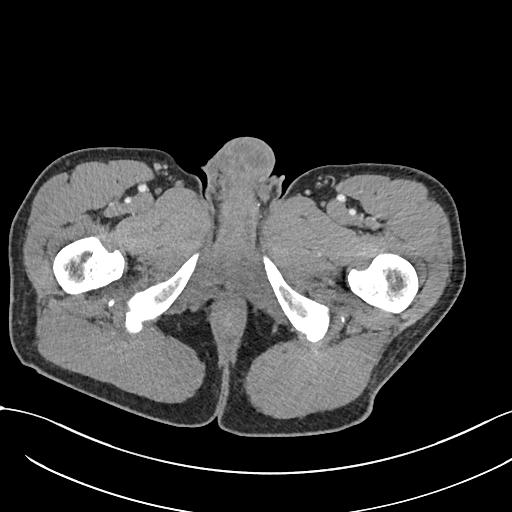
[im 10/98  bone]
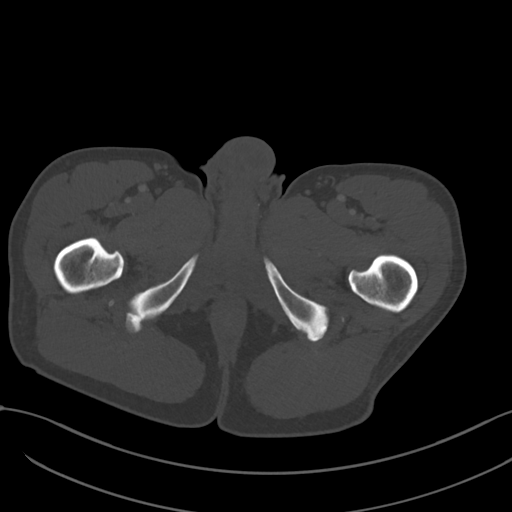
[im 20/98  soft-tissue]
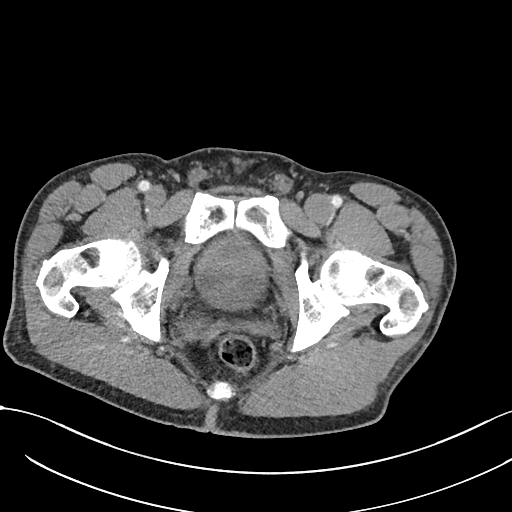
[im 30/98  soft-tissue]
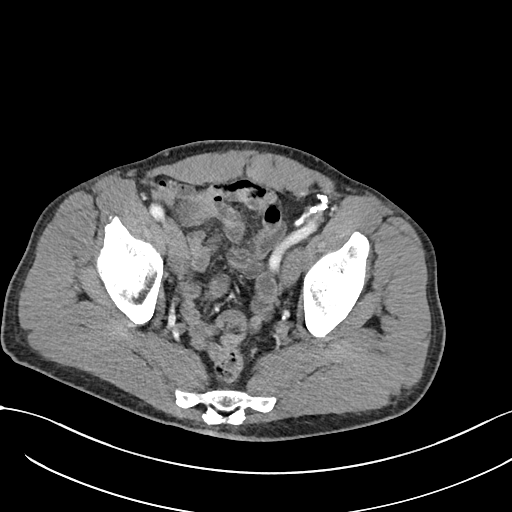
[im 39/98  soft-tissue]
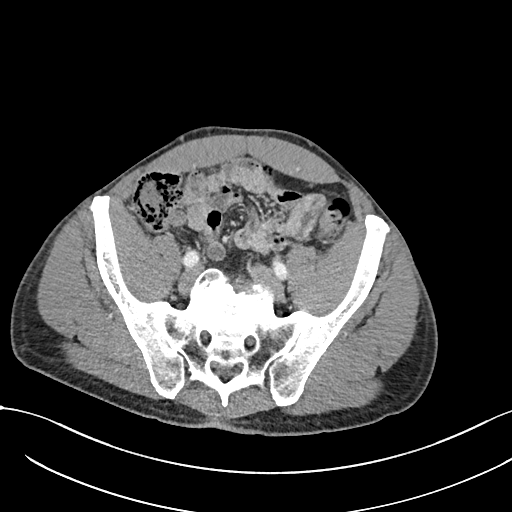
[im 49/98  soft-tissue]
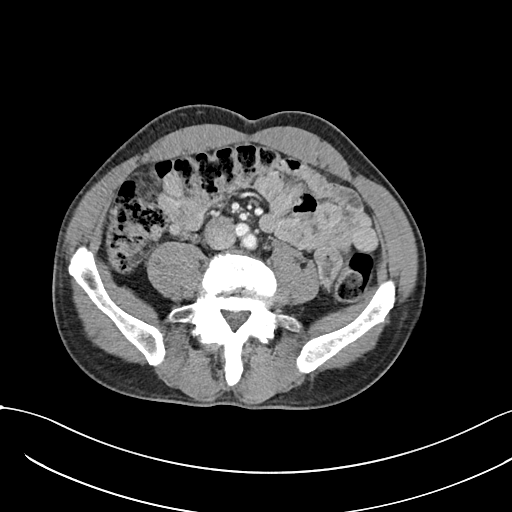
[im 59/98  soft-tissue]
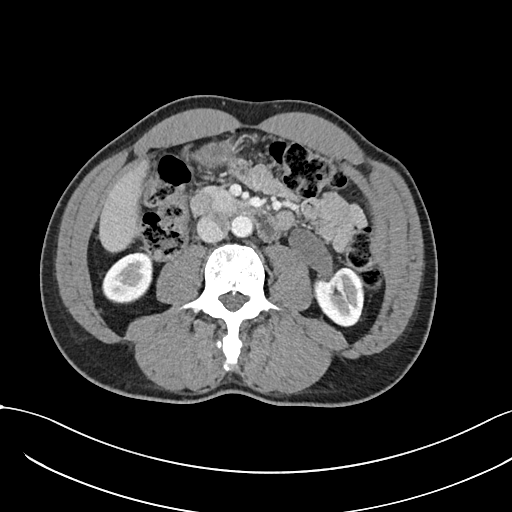
[im 68/98  soft-tissue]
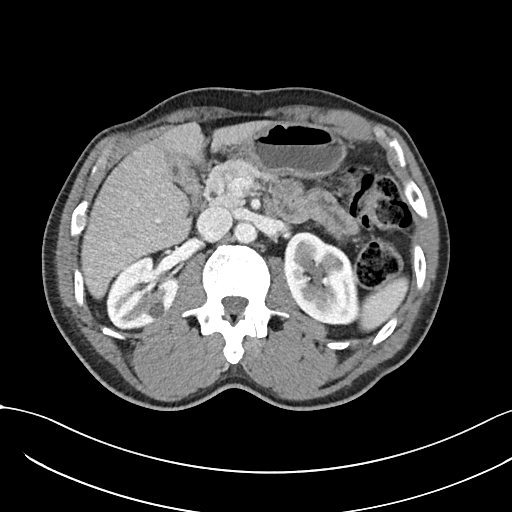
[im 78/98  soft-tissue]
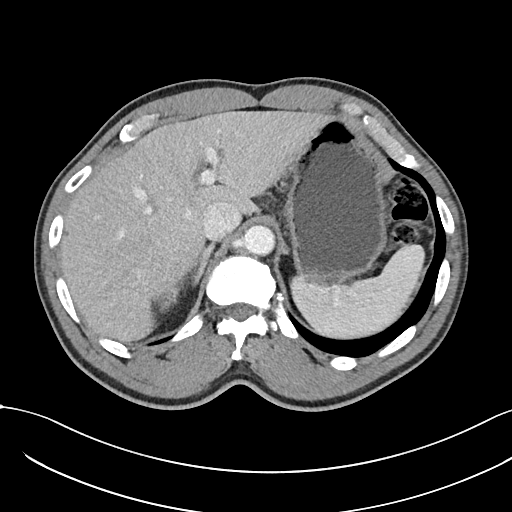
[im 88/98  soft-tissue]
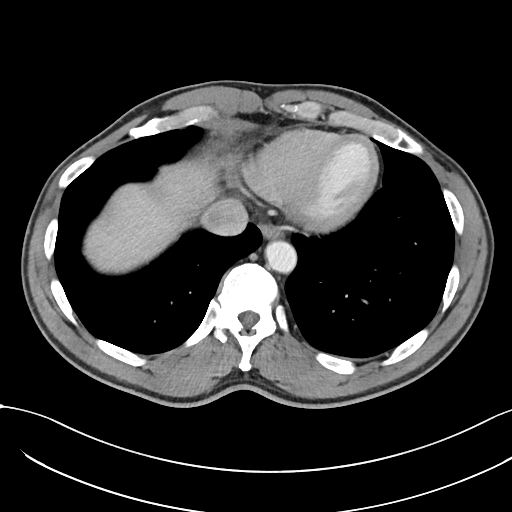
[im 88/98  bone]
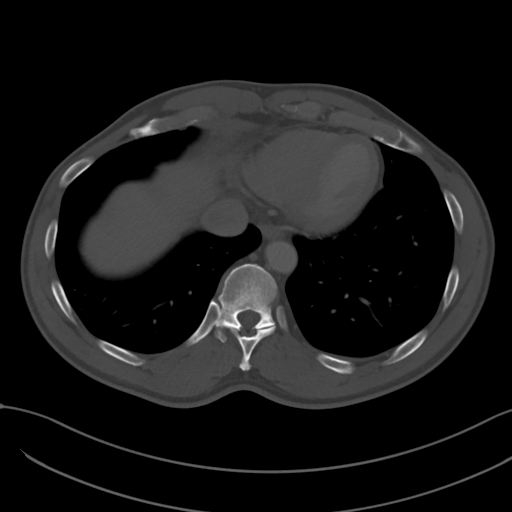

[12 of 46 positions shown; findings below may reference images not displayed]

FINDINGS: Lower chest: Clear lung bases. No significant pleural or pericardial
effusion.

Hepatobiliary: The liver is normal in density without focal
abnormality. No evidence of gallstones, gallbladder wall thickening
or biliary dilatation.

Pancreas: Unremarkable. No pancreatic ductal dilatation or
surrounding inflammatory changes.

Spleen: 6 mm low-density lesion laterally in the spleen on image
[DATE], likely incidental. The spleen is normal in size without
suspicious finding.

Adrenals/Urinary Tract: Both adrenal glands appear normal.
Pre-contrast images demonstrate no renal, ureteral or bladder
calculi. Post-contrast, both kidneys enhance normally. There is no
evidence of enhancing renal mass. There is a 12 mm cyst posteriorly
in the mid right kidney (image [DATE]). Tiny low-density lesion in
the upper pole the left kidney is too small to characterize (image
[DATE]). The left renal pelvis is mildly dilated with slightly
delayed excretion into the left collecting system. Appearance
similar to previous study. No evidence of ureteral calculus or
high-grade obstruction. The bladder appears unremarkable.

Stomach/Bowel: No evidence of bowel wall thickening, distention or
surrounding inflammatory change. Mild diverticular changes in the
sigmoid colon.

Vascular/Lymphatic: There are no enlarged abdominal or pelvic lymph
nodes. Minimal aortoiliac atherosclerosis. No acute vascular
findings. Aberrant origin of the right renal artery proximal to the
celiac trunk.

Reproductive: The prostate gland appears mildly enlarged without
apparent focal abnormality. The seminal vesicles appear normal.

Other: Stable postsurgical changes from left inguinal herniorrhaphy.
No recurrent hernia or ascites.

Musculoskeletal: No acute or significant osseous findings. Previous
laminectomy and interbody fusion at L5-S1. Prominent disc
degeneration at L4-5 with predominately right-sided foraminal
narrowing.
IMPRESSION: 1. Mild dilatation of the left renal pelvis, similar to previous
noncontrast CT. There is mildly delayed contrast excretion on the
left, but no evidence of ureteral calculus or high-grade ureteral
obstruction. Findings could indicate mild UPJ stenosis.
2. Probable small renal cysts bilaterally.
3. No acute findings.
4. Mild Aortic Atherosclerosis (E5173-LM0.0).

## 2019-12-11 DIAGNOSIS — Q6211 Congenital occlusion of ureteropelvic junction: Secondary | ICD-10-CM | POA: Diagnosis not present

## 2019-12-11 DIAGNOSIS — R300 Dysuria: Secondary | ICD-10-CM | POA: Diagnosis not present

## 2019-12-11 DIAGNOSIS — Z125 Encounter for screening for malignant neoplasm of prostate: Secondary | ICD-10-CM | POA: Diagnosis not present

## 2019-12-11 DIAGNOSIS — N390 Urinary tract infection, site not specified: Secondary | ICD-10-CM | POA: Diagnosis not present

## 2019-12-11 DIAGNOSIS — N5201 Erectile dysfunction due to arterial insufficiency: Secondary | ICD-10-CM | POA: Diagnosis not present

## 2020-03-11 ENCOUNTER — Ambulatory Visit (INDEPENDENT_AMBULATORY_CARE_PROVIDER_SITE_OTHER): Payer: Medicare Other

## 2020-03-11 ENCOUNTER — Ambulatory Visit (INDEPENDENT_AMBULATORY_CARE_PROVIDER_SITE_OTHER): Payer: Medicare Other | Admitting: Family Medicine

## 2020-03-11 ENCOUNTER — Other Ambulatory Visit: Payer: Self-pay

## 2020-03-11 ENCOUNTER — Encounter: Payer: Self-pay | Admitting: Family Medicine

## 2020-03-11 VITALS — BP 130/80 | HR 99 | Temp 98.4°F | Resp 16 | Ht 75.0 in | Wt 186.0 lb

## 2020-03-11 VITALS — BP 130/80 | HR 58 | Ht 75.0 in | Wt 186.0 lb

## 2020-03-11 DIAGNOSIS — J0100 Acute maxillary sinusitis, unspecified: Secondary | ICD-10-CM | POA: Diagnosis not present

## 2020-03-11 DIAGNOSIS — Z0000 Encounter for general adult medical examination without abnormal findings: Secondary | ICD-10-CM | POA: Diagnosis not present

## 2020-03-11 DIAGNOSIS — Z1211 Encounter for screening for malignant neoplasm of colon: Secondary | ICD-10-CM

## 2020-03-11 MED ORDER — AMOXICILLIN 500 MG PO CAPS: 500.0000 mg | ORAL_CAPSULE | Freq: Three times a day (TID) | ORAL | 0 refills | Status: DC

## 2020-03-11 NOTE — Progress Notes (Signed)
Subjective:   Patrick Mcknight is a 69 y.o. male who presents for an Initial Medicare Annual Wellness Visit.  Review of Systems   Cardiac Risk Factors include: advanced age (>31men, >63 women)    Objective:    Today's Vitals   03/11/20 0820  BP: 130/80  Pulse: 99  Resp: 16  Temp: 98.4 F (36.9 C)  TempSrc: Temporal  SpO2: 99%  Weight: 186 lb (84.4 kg)  Height: 6\' 3"  (1.905 m)   Body mass index is 23.25 kg/m.  Advanced Directives 03/11/2020  Does Patient Have a Medical Advance Directive? No  Would patient like information on creating a medical advance directive? Yes (MAU/Ambulatory/Procedural Areas - Information given)    Current Medications (verified) Outpatient Encounter Medications as of 03/11/2020  Medication Sig  . aspirin EC 81 MG tablet Take 81 mg by mouth daily.  . Cholecalciferol (VITAMIN D3) 5000 units CAPS Take 1 capsule by mouth daily.  03/13/2020 latanoprost (XALATAN) 0.005 % ophthalmic solution Place 1 drop into both eyes at bedtime.  . Multiple Vitamin (MULTIVITAMIN) tablet Take 1 tablet by mouth daily.  . Omega-3 Fatty Acids (FISH OIL CONCENTRATE PO) Take by mouth.  . timolol (TIMOPTIC) 0.25 % ophthalmic solution Place 1 drop into both eyes every morning.  . vitamin E (VITAMIN E) 180 MG (400 UNITS) capsule Take 400 Units by mouth daily.  . [DISCONTINUED] azithromycin (ZITHROMAX) 250 MG tablet Use as directed  . [DISCONTINUED] LUMIGAN 0.01 % SOLN Place 1 drop into both eyes at bedtime.  . [DISCONTINUED] mupirocin ointment (BACTROBAN) 2 % Apply 1 application topically 2 (two) times daily.   No facility-administered encounter medications on file as of 03/11/2020.    Allergies (verified) Crestor [rosuvastatin]   History: Past Medical History:  Diagnosis Date  . Erectile dysfunction   . GERD (gastroesophageal reflux disease)    Past Surgical History:  Procedure Laterality Date  . URETHRA SURGERY  1990's   stent placement   Family History  Family history  unknown: Yes   Social History   Socioeconomic History  . Marital status: Married    Spouse name: Not on file  . Number of children: 2  . Years of education: Not on file  . Highest education level: Not on file  Occupational History  . Not on file  Tobacco Use  . Smoking status: Never Smoker  . Smokeless tobacco: Never Used  Substance and Sexual Activity  . Alcohol use: Not Currently    Alcohol/week: 0.0 standard drinks  . Drug use: No  . Sexual activity: Yes  Other Topics Concern  . Not on file  Social History Narrative   2 adopted children.    Social Determinants of Health   Financial Resource Strain: Low Risk   . Difficulty of Paying Living Expenses: Not hard at all  Food Insecurity: No Food Insecurity  . Worried About 03/13/2020 in the Last Year: Never true  . Ran Out of Food in the Last Year: Never true  Transportation Needs: No Transportation Needs  . Lack of Transportation (Medical): No  . Lack of Transportation (Non-Medical): No  Physical Activity: Sufficiently Active  . Days of Exercise per Week: 5 days  . Minutes of Exercise per Session: 60 min  Stress: No Stress Concern Present  . Feeling of Stress : Not at all  Social Connections: Slightly Isolated  . Frequency of Communication with Friends and Family: More than three times a week  . Frequency of Social Gatherings with  Friends and Family: More than three times a week  . Attends Religious Services: More than 4 times per year  . Active Member of Clubs or Organizations: No  . Attends Banker Meetings: Never  . Marital Status: Married   Tobacco Counseling Counseling given: Not Answered   Clinical Intake:  Pre-visit preparation completed: Yes  Pain : No/denies pain     BMI - recorded: 23.25 Nutritional Status: BMI of 19-24  Normal Nutritional Risks: None Diabetes: No  How often do you need to have someone help you when you read instructions, pamphlets, or other written  materials from your doctor or pharmacy?: 1 - Never  Interpreter Needed?: No  Information entered by :: Reather Littler LPN  Activities of Daily Living In your present state of health, do you have any difficulty performing the following activities: 03/11/2020  Hearing? N  Comment declines hearing aids  Vision? N  Difficulty concentrating or making decisions? N  Walking or climbing stairs? N  Dressing or bathing? N  Doing errands, shopping? N  Preparing Food and eating ? N  Using the Toilet? N  In the past six months, have you accidently leaked urine? N  Do you have problems with loss of bowel control? N  Managing your Medications? N  Managing your Finances? N  Housekeeping or managing your Housekeeping? N  Some recent data might be hidden     Immunizations and Health Maintenance Immunization History  Administered Date(s) Administered  . PFIZER SARS-COV-2 Vaccination 12/08/2019, 12/29/2019  . Pneumococcal Conjugate-13 03/10/2016  . Pneumococcal Polysaccharide-23 03/09/2017   There are no preventive care reminders to display for this patient.  Patient Care Team: Duanne Limerick, MD as PCP - General (Family Medicine)  Indicate any recent Medical Services you may have received from other than [facility] providers in the past year (date may be approximate).    Assessment:   This is a routine wellness examination for Patrick Mcknight.  Hearing/Vision screen  Hearing Screening   125Hz  250Hz  500Hz  1000Hz  2000Hz  3000Hz  4000Hz  6000Hz  8000Hz   Right ear:           Left ear:           Comments: Pt denies hearing difficulty  Vision Screening Comments: Annual vision screenings done by Dr.  Dietary issues and exercise activities discussed: Current Exercise Habits: Structured exercise class, Type of exercise: strength training/weights;calisthenics, Time (Minutes): 60, Frequency (Times/Week): 5, Weekly Exercise (Minutes/Week): 300, Intensity: Moderate, Exercise limited by: None identified  Goals    None    Depression Screen PHQ 2/9 Scores 03/11/2020 03/09/2019 01/11/2019 03/09/2017  PHQ - 2 Score 0 0 0 0  PHQ- 9 Score - 0 0 -    Fall Risk Fall Risk  03/11/2020 12/15/2018 03/09/2017 03/10/2016  Falls in the past year? 0 0 No No  Number falls in past yr: 0 0 - -  Injury with Fall? 0 0 - -  Risk for fall due to : No Fall Risks - - -  Follow up Falls prevention discussed - - -    FALL RISK PREVENTION PERTAINING TO THE HOME:  Any stairs in or around the home? Yes  If so, do they handrails? Yes   Home free of loose throw rugs in walkways, pet beds, electrical cords, etc? Yes  Adequate lighting in your home to reduce risk of falls? Yes   ASSISTIVE DEVICES UTILIZED TO PREVENT FALLS:  Life alert? No  Use of a cane, walker or w/c? No  Grab bars in the bathroom? Yes  Shower chair or bench in shower? Yes  Elevated toilet seat or a handicapped toilet? Yes   DME ORDERS:  DME order needed?  No   TIMED UP AND GO:  Was the test performed? Yes .  Length of time to ambulate 10 feet: 5 sec.   GAIT:  Appearance of gait: Gait stead-fast and without the use of an assistive device.   Education: Fall risk prevention has been discussed.  Intervention(s) required? No    Cognitive Function: 6CIT deferred, pt has no memory issues.          Screening Tests Health Maintenance  Topic Date Due  . INFLUENZA VACCINE  05/26/2020  . TETANUS/TDAP  10/26/2022  . COLONOSCOPY  10/26/2024  . COVID-19 Vaccine  Completed  . Hepatitis C Screening  Completed  . PNA vac Low Risk Adult  Completed    Qualifies for Shingles Vaccine? Yes . Due for Shingrix. Education has been provided regarding the importance of this vaccine. Pt has been advised to call insurance company to determine out of pocket expense. Advised may also receive vaccine at local pharmacy or Health Dept. Verbalized acceptance and understanding.  Tdap: Although this vaccine is not a covered service during a Wellness Exam, does the  patient still wish to receive this vaccine today?  No .  Education has been provided regarding the importance of this vaccine. Advised may receive this vaccine at local pharmacy or Health Dept. Aware to provide a copy of the vaccination record if obtained from local pharmacy or Health Dept. Verbalized acceptance and understanding.  Flu Vaccine: Due for Flu vaccine. Does the patient want to receive this vaccine today?  No . Education has been provided regarding the importance of this vaccine but still declined. Advised may receive this vaccine at local pharmacy or Health Dept. Aware to provide a copy of the vaccination record if obtained from local pharmacy or Health Dept. Verbalized acceptance and understanding.  Pneumococcal Vaccine: Up to date  Covid-19 Vaccine: Up to date   Cancer Screenings:  Colorectal Screening: Completed age 37 approx 2012. Repeat every 10 years;   Lung Cancer Screening: (Low Dose CT Chest recommended if Age 33-80 years, 30 pack-year currently smoking OR have quit w/in 15years.) does not qualify.   Additional Screening:  Hepatitis C Screening: does qualify; Completed 03/07/18  Vision Screening: Recommended annual ophthalmology exams for early detection of glaucoma and other disorders of the eye. Is the patient up to date with their annual eye exam?  Yes  Who is the provider or what is the name of the office in which the pt attends annual eye exams? Dr. Larence Penning  Dental Screening: Recommended annual dental exams for proper oral hygiene  Community Resource Referral:  CRR required this visit?  No        Plan:    I have personally reviewed and addressed the Medicare Annual Wellness questionnaire and have noted the following in the patient's chart:  A. Medical and social history B. Use of alcohol, tobacco or illicit drugs  C. Current medications and supplements D. Functional ability and status E.  Nutritional status F.  Physical activity G. Advance  directives H. List of other physicians I.  Hospitalizations, surgeries, and ER visits in previous 12 months J.  Vitals K. Screenings such as hearing and vision if needed, cognitive and depression L. Referrals and appointments   In addition, I have reviewed and discussed with patient certain preventive protocols, quality metrics,  and best practice recommendations. A written personalized care plan for preventive services as well as general preventive health recommendations were provided to patient.   Signed,  Clemetine Marker, LPN  Nurse Health Advisor   Nurse Notes: pt doing well and appreciative of visit today

## 2020-03-11 NOTE — Patient Instructions (Signed)
Patrick Mcknight , Thank you for taking time to come for your Medicare Wellness Visit. I appreciate your ongoing commitment to your health goals. Please review the following plan we discussed and let me know if I can assist you in the future.   Screening recommendations/referrals: Colonoscopy: due age 69 Recommended yearly ophthalmology/optometry visit for glaucoma screening and checkup Recommended yearly dental visit for hygiene and checkup  Vaccinations: Influenza vaccine: postponed Pneumococcal vaccine: done 03/09/17 Tdap vaccine: due Shingles vaccine: Shingrix discussed. Please contact your pharmacy for coverage information.  Covid-19: done 12/08/19 & 12/29/19    Advanced directives: Advance directive discussed with you today. I have provided a copy for you to complete at home and have notarized. Once this is complete please bring a copy in to our office so we can scan it into your chart.  Conditions/risks identified: Keep up the great work!  Next appointment: Please follow up in one year for your Medicare Annual Wellness visit.    Preventive Care 26 Years and Older, Male Preventive care refers to lifestyle choices and visits with your health care provider that can promote health and wellness. What does preventive care include?  A yearly physical exam. This is also called an annual well check.  Dental exams once or twice a year.  Routine eye exams. Ask your health care provider how often you should have your eyes checked.  Personal lifestyle choices, including:  Daily care of your teeth and gums.  Regular physical activity.  Eating a healthy diet.  Avoiding tobacco and drug use.  Limiting alcohol use.  Practicing safe sex.  Taking low doses of aspirin every day.  Taking vitamin and mineral supplements as recommended by your health care provider. What happens during an annual well check? The services and screenings done by your health care provider during your annual well  check will depend on your age, overall health, lifestyle risk factors, and family history of disease. Counseling  Your health care provider may ask you questions about your:  Alcohol use.  Tobacco use.  Drug use.  Emotional well-being.  Home and relationship well-being.  Sexual activity.  Eating habits.  History of falls.  Memory and ability to understand (cognition).  Work and work Astronomer. Screening  You may have the following tests or measurements:  Height, weight, and BMI.  Blood pressure.  Lipid and cholesterol levels. These may be checked every 5 years, or more frequently if you are over 1 years old.  Skin check.  Lung cancer screening. You may have this screening every year starting at age 4 if you have a 30-pack-year history of smoking and currently smoke or have quit within the past 15 years.  Fecal occult blood test (FOBT) of the stool. You may have this test every year starting at age 36.  Flexible sigmoidoscopy or colonoscopy. You may have a sigmoidoscopy every 5 years or a colonoscopy every 10 years starting at age 73.  Prostate cancer screening. Recommendations will vary depending on your family history and other risks.  Hepatitis C blood test.  Hepatitis B blood test.  Sexually transmitted disease (STD) testing.  Diabetes screening. This is done by checking your blood sugar (glucose) after you have not eaten for a while (fasting). You may have this done every 1-3 years.  Abdominal aortic aneurysm (AAA) screening. You may need this if you are a current or former smoker.  Osteoporosis. You may be screened starting at age 70 if you are at high risk. Talk with your  health care provider about your test results, treatment options, and if necessary, the need for more tests. Vaccines  Your health care provider may recommend certain vaccines, such as:  Influenza vaccine. This is recommended every year.  Tetanus, diphtheria, and acellular  pertussis (Tdap, Td) vaccine. You may need a Td booster every 10 years.  Zoster vaccine. You may need this after age 103.  Pneumococcal 13-valent conjugate (PCV13) vaccine. One dose is recommended after age 51.  Pneumococcal polysaccharide (PPSV23) vaccine. One dose is recommended after age 34. Talk to your health care provider about which screenings and vaccines you need and how often you need them. This information is not intended to replace advice given to you by your health care provider. Make sure you discuss any questions you have with your health care provider. Document Released: 11/08/2015 Document Revised: 07/01/2016 Document Reviewed: 08/13/2015 Elsevier Interactive Patient Education  2017 [employer] Prevention in the Home Falls can cause injuries. They can happen to people of all ages. There are many things you can do to make your home safe and to help prevent falls. What can I do on the outside of my home?  Regularly fix the edges of walkways and driveways and fix any cracks.  Remove anything that might make you trip as you walk through a door, such as a raised step or threshold.  Trim any bushes or trees on the path to your home.  Use bright outdoor lighting.  Clear any walking paths of anything that might make someone trip, such as rocks or tools.  Regularly check to see if handrails are loose or broken. Make sure that both sides of any steps have handrails.  Any raised decks and porches should have guardrails on the edges.  Have any leaves, snow, or ice cleared regularly.  Use sand or salt on walking paths during winter.  Clean up any spills in your garage right away. This includes oil or grease spills. What can I do in the bathroom?  Use night lights.  Install grab bars by the toilet and in the tub and shower. Do not use towel bars as grab bars.  Use non-skid mats or decals in the tub or shower.  If you need to sit down in the shower, use a plastic,  non-slip stool.  Keep the floor dry. Clean up any water that spills on the floor as soon as it happens.  Remove soap buildup in the tub or shower regularly.  Attach bath mats securely with double-sided non-slip rug tape.  Do not have throw rugs and other things on the floor that can make you trip. What can I do in the bedroom?  Use night lights.  Make sure that you have a light by your bed that is easy to reach.  Do not use any sheets or blankets that are too big for your bed. They should not hang down onto the floor.  Have a firm chair that has side arms. You can use this for support while you get dressed.  Do not have throw rugs and other things on the floor that can make you trip. What can I do in the kitchen?  Clean up any spills right away.  Avoid walking on wet floors.  Keep items that you use a lot in easy-to-reach places.  If you need to reach something above you, use a strong step stool that has a grab bar.  Keep electrical cords out of the way.  Do not use  floor polish or wax that makes floors slippery. If you must use wax, use non-skid floor wax.  Do not have throw rugs and other things on the floor that can make you trip. What can I do with my stairs?  Do not leave any items on the stairs.  Make sure that there are handrails on both sides of the stairs and use them. Fix handrails that are broken or loose. Make sure that handrails are as long as the stairways.  Check any carpeting to make sure that it is firmly attached to the stairs. Fix any carpet that is loose or worn.  Avoid having throw rugs at the top or bottom of the stairs. If you do have throw rugs, attach them to the floor with carpet tape.  Make sure that you have a light switch at the top of the stairs and the bottom of the stairs. If you do not have them, ask someone to add them for you. What else can I do to help prevent falls?  Wear shoes that:  Do not have high heels.  Have rubber  bottoms.  Are comfortable and fit you well.  Are closed at the toe. Do not wear sandals.  If you use a stepladder:  Make sure that it is fully opened. Do not climb a closed stepladder.  Make sure that both sides of the stepladder are locked into place.  Ask someone to hold it for you, if possible.  Clearly mark and make sure that you can see:  Any grab bars or handrails.  First and last steps.  Where the edge of each step is.  Use tools that help you move around (mobility aids) if they are needed. These include:  Canes.  Walkers.  Scooters.  Crutches.  Turn on the lights when you go into a dark area. Replace any light bulbs as soon as they burn out.  Set up your furniture so you have a clear path. Avoid moving your furniture around.  If any of your floors are uneven, fix them.  If there are any pets around you, be aware of where they are.  Review your medicines with your doctor. Some medicines can make you feel dizzy. This can increase your chance of falling. Ask your doctor what other things that you can do to help prevent falls. This information is not intended to replace advice given to you by your health care provider. Make sure you discuss any questions you have with your health care provider. Document Released: 08/08/2009 Document Revised: 03/19/2016 Document Reviewed: 11/16/2014 Elsevier Interactive Patient Education  2017 [employer].

## 2020-03-11 NOTE — Progress Notes (Signed)
Date:  03/11/2020   Name:  Patrick Mcknight   DOB:  1951/09/30   MRN:  [id number]   Chief Complaint: Annual Exam and Sinusitis (thick, nasal production)  Patient is a 69 year old male who presents for a comprehensive physical exam. The patient reports the following problems: sinusitis. Health maintenance has been reviewed colonoscopy.  Sinusitis This is a new problem. The current episode started in the past 7 days (Tuesday). The problem is unchanged. There has been no fever. Associated symptoms include congestion and sinus pressure. Pertinent negatives include no chills, coughing, diaphoresis, ear pain, headaches, neck pain, shortness of breath or sore throat. Past treatments include nothing.    Lab Results  Component Value Date   CREATININE 1.14 03/15/2019   BUN 12 03/15/2019   NA 137 03/15/2019   K 4.6 03/15/2019   CL 102 03/15/2019   CO2 25 03/15/2019   Lab Results  Component Value Date   CHOL 234 (H) 03/15/2019   HDL 55 03/15/2019   LDLCALC 158 (H) 03/15/2019   TRIG 105 03/15/2019   CHOLHDL 4.3 03/15/2019   Lab Results  Component Value Date   TSH 1.010 04/14/2019   No results found for: HGBA1C Lab Results  Component Value Date   WBC 6.4 04/14/2019   HGB 11.6 (L) 04/14/2019   HCT 34.0 (L) 04/14/2019   MCV 90 04/14/2019   PLT 506 (H) 04/14/2019   Lab Results  Component Value Date   ALT 38 04/14/2019   AST 31 04/14/2019   ALKPHOS 59 04/14/2019   BILITOT 0.3 04/14/2019     Review of Systems  Constitutional: Negative for chills, diaphoresis and fever.  HENT: Positive for congestion and sinus pressure. Negative for drooling, ear discharge, ear pain and sore throat.   Respiratory: Negative for cough, shortness of breath and wheezing.   Cardiovascular: Negative for chest pain, palpitations and leg swelling.  Gastrointestinal: Negative for abdominal pain, blood in stool, constipation, diarrhea and nausea.  Endocrine: Negative for polydipsia.  Genitourinary:  Negative for dysuria, frequency, hematuria and urgency.  Musculoskeletal: Negative for back pain, myalgias and neck pain.  Skin: Negative for rash.  Allergic/Immunologic: Negative for environmental allergies.  Neurological: Negative for dizziness and headaches.  Hematological: Does not bruise/bleed easily.  Psychiatric/Behavioral: Negative for suicidal ideas. The patient is not nervous/anxious.     Patient Active Problem List   Diagnosis Date Noted  . Aortic atherosclerosis (HCC) 12/15/2018  . Glaucoma 03/09/2017  . Diverticulosis 02/14/2015  . Irritable bowel syndrome (IBS) 02/14/2015  . Hx of iron deficiency anemia 02/14/2015  . Hyperlipemia intolerant statins 02/14/2015  . Vitamin D deficiency 02/14/2015  . Erectile dysfunction 02/14/2015  . Ureteral stricture s/p stent 02/14/2015    Allergies  Allergen Reactions  . Crestor [Rosuvastatin]     kidney pain    Past Surgical History:  Procedure Laterality Date  . URETHRA SURGERY  1990's   stent placement    Social History   Tobacco Use  . Smoking status: Never Smoker  . Smokeless tobacco: Never Used  Substance Use Topics  . Alcohol use: Not Currently    Alcohol/week: 0.0 standard drinks  . Drug use: No     Medication list has been reviewed and updated.  Current Meds  Medication Sig  . aspirin EC 81 MG tablet Take 81 mg by mouth daily.  . Cholecalciferol (VITAMIN D3) 5000 units CAPS Take 1 capsule by mouth daily.  Marland Kitchen latanoprost (XALATAN) 0.005 % ophthalmic solution Place 1  drop into both eyes at bedtime.  . Multiple Vitamin (MULTIVITAMIN) tablet Take 1 tablet by mouth daily.  . Omega-3 Fatty Acids (FISH OIL CONCENTRATE PO) Take by mouth.  . timolol (TIMOPTIC) 0.25 % ophthalmic solution Place 1 drop into both eyes every morning.  . vitamin E (VITAMIN E) 180 MG (400 UNITS) capsule Take 400 Units by mouth daily.    PHQ 2/9 Scores 03/11/2020 03/09/2019 01/11/2019 03/09/2017  PHQ - 2 Score 0 0 0 0  PHQ- 9 Score - 0 0  -    BP Readings from Last 3 Encounters:  03/11/20 130/80  03/11/20 130/80  04/14/19 120/60    Physical Exam Vitals and nursing note reviewed. Exam conducted with a chaperone present.  Constitutional:      Appearance: Normal appearance. He is well-developed, well-groomed and normal weight.  HENT:     Head: Normocephalic.     Jaw: There is normal jaw occlusion.     Right Ear: Hearing, tympanic membrane, ear canal and external ear normal.     Left Ear: Hearing, tympanic membrane, ear canal and external ear normal.     Nose: Nose normal.     Right Turbinates: Swollen. Not enlarged.     Left Turbinates: Swollen. Not enlarged.     Mouth/Throat:     Lips: Pink.     Mouth: Mucous membranes are moist.     Palate: No mass.     Pharynx: Oropharynx is clear.  Eyes:     General: Lids are normal. No scleral icterus.       Right eye: No discharge.        Left eye: No discharge.     Conjunctiva/sclera: Conjunctivae normal.     Pupils: Pupils are equal, round, and reactive to light.     Funduscopic exam:    Right eye: Red reflex present.        Left eye: Red reflex present. Neck:     Thyroid: No thyroid mass, thyromegaly or thyroid tenderness.     Vascular: Normal carotid pulses. No carotid bruit, hepatojugular reflux or JVD.     Trachea: Trachea and phonation normal. No tracheal deviation.  Cardiovascular:     Rate and Rhythm: Normal rate and regular rhythm.     Chest Wall: PMI is not displaced. No thrill.     Pulses: Normal pulses.          Carotid pulses are 2+ on the right side and 2+ on the left side.      Radial pulses are 2+ on the right side and 2+ on the left side.       Femoral pulses are 2+ on the right side and 2+ on the left side.      Popliteal pulses are 2+ on the right side and 2+ on the left side.       Dorsalis pedis pulses are 2+ on the right side and 2+ on the left side.       Posterior tibial pulses are 2+ on the right side and 2+ on the left side.     Heart  sounds: Normal heart sounds, S1 normal and S2 normal. No murmur. No systolic murmur. No diastolic murmur. No friction rub. No gallop. No S3 or S4 sounds.   Pulmonary:     Effort: Pulmonary effort is normal. No respiratory distress.     Breath sounds: Normal breath sounds and air entry. No decreased breath sounds, wheezing, rhonchi or rales.  Chest:  Chest wall: No mass.     Breasts: Breasts are symmetrical.        Right: Normal.        Left: Normal.  Abdominal:     General: Bowel sounds are normal. There is no abdominal bruit.     Palpations: Abdomen is soft. There is no hepatomegaly, splenomegaly or mass.     Tenderness: There is no abdominal tenderness. There is no guarding or rebound.  Genitourinary:    Penis: Normal.      Testes: Normal.        Right: Mass not present.        Left: Mass not present.     Epididymis:     Right: Normal.     Left: Normal.     Prostate: Normal. Not enlarged, not tender and no nodules present.     Rectum: Normal. Guaiac result negative. No mass.  Musculoskeletal:        General: No tenderness. Normal range of motion.     Cervical back: Normal, full passive range of motion without pain, normal range of motion and neck supple.     Thoracic back: Normal.     Lumbar back: Normal.     Right lower leg: No edema.     Left lower leg: No edema.  Lymphadenopathy:     Head:     Right side of head: No submental or submandibular adenopathy.     Left side of head: No submental or submandibular adenopathy.     Cervical: No cervical adenopathy.     Right cervical: No superficial, deep or posterior cervical adenopathy.    Left cervical: No superficial, deep or posterior cervical adenopathy.     Upper Body:     Right upper body: No supraclavicular or axillary adenopathy.     Left upper body: No supraclavicular or axillary adenopathy.     Lower Body: No right inguinal adenopathy. No left inguinal adenopathy.  Skin:    General: Skin is warm.     Capillary  Refill: Capillary refill takes less than 2 seconds.     Findings: No rash.  Neurological:     General: No focal deficit present.     Mental Status: He is alert and oriented to person, place, and time.     Cranial Nerves: Cranial nerves are intact. No cranial nerve deficit.     Sensory: Sensation is intact.     Motor: Motor function is intact.     Deep Tendon Reflexes: Reflexes are normal and symmetric.  Psychiatric:        Behavior: Behavior is cooperative.     Wt Readings from Last 3 Encounters:  03/11/20 186 lb (84.4 kg)  03/11/20 186 lb (84.4 kg)  04/14/19 182 lb (82.6 kg)    BP 130/80   Pulse (!) 58   Ht 6\' 3"  (1.905 m)   Wt 186 lb (84.4 kg)   BMI 23.25 kg/m   Assessment and Plan:  1. Annual physical exam No subjective/objective concerns noted during history and physical exam.  Patient's chart was reviewed and noted for previous encounters, most recent labs, most recent imaging, and care everywhere showed no concerns.JABES PRIMO is a 69 y.o. male who presents today for his Complete Annual Exam. He feels well. He reports exercising . He reports he is sleeping well.Immunizations are reviewed and recommendations provided.   Age appropriate screening tests are discussed. Counseling given for risk factor reduction interventions.  Most recent labs were reviewed and  were in normal limits..   2. Acute non-recurrent maxillary sinusitis New onset.  Acute.  Persistent.  Relatively stable.  Exam is consistent with an acute maxillary sinusitis with tenderness over the maxillary sinuses bilateral.  Will initiate amoxicillin 500 mg 3 times a day for 10 days. - amoxicillin (AMOXIL) 500 MG capsule; Take 1 capsule (500 mg total) by mouth 3 (three) times daily.  Dispense: 30 capsule; Refill: 0  3. Colon cancer screening On review it looks like Dr. Gustavo Lah did his last colonoscopy.  Is uncertain whether it was only endoscopy with colonoscopy but gastroenterology was contacted and he will be  due in June and it has been set up for him to be evaluated and colonoscopy done at that time. - Ambulatory referral to Gastroenterology

## 2020-04-15 ENCOUNTER — Telehealth: Payer: Self-pay | Admitting: Family Medicine

## 2020-04-15 NOTE — Telephone Encounter (Signed)
Called and left message on machine stating that he will need to be seen before ordering lab work on him. Unsure of what he is wanting drawn. Advised him to call and schedule an appt

## 2020-04-15 NOTE — Telephone Encounter (Unsigned)
Copied from CRM [id number]. Topic: General - Inquiry >> Apr 15, 2020 11:17 AM Leary Roca wrote: Reason for CRM: Pt is wanting lab work done . Please advise

## 2020-04-16 ENCOUNTER — Ambulatory Visit: Payer: Medicare Other

## 2020-04-22 ENCOUNTER — Other Ambulatory Visit: Payer: Self-pay

## 2020-04-22 ENCOUNTER — Ambulatory Visit (INDEPENDENT_AMBULATORY_CARE_PROVIDER_SITE_OTHER): Payer: Medicare Other | Admitting: Family Medicine

## 2020-04-22 ENCOUNTER — Encounter: Payer: Self-pay | Admitting: Family Medicine

## 2020-04-22 VITALS — BP 120/64 | HR 60 | Ht 75.0 in | Wt 186.0 lb

## 2020-04-22 DIAGNOSIS — I700 Atherosclerosis of aorta: Secondary | ICD-10-CM

## 2020-04-22 DIAGNOSIS — Z862 Personal history of diseases of the blood and blood-forming organs and certain disorders involving the immune mechanism: Secondary | ICD-10-CM

## 2020-04-22 DIAGNOSIS — E8809 Other disorders of plasma-protein metabolism, not elsewhere classified: Secondary | ICD-10-CM

## 2020-04-22 DIAGNOSIS — L910 Hypertrophic scar: Secondary | ICD-10-CM | POA: Diagnosis not present

## 2020-04-22 DIAGNOSIS — E7801 Familial hypercholesterolemia: Secondary | ICD-10-CM

## 2020-04-22 DIAGNOSIS — R351 Nocturia: Secondary | ICD-10-CM

## 2020-04-22 DIAGNOSIS — Z789 Other specified health status: Secondary | ICD-10-CM

## 2020-04-22 NOTE — Progress Notes (Signed)
Date:  04/22/2020   Name:  Patrick Mcknight   DOB:  [birth year]   MRN:  [id number]   Chief Complaint: Tick Removal (place on arm from where tick bit him- left a knot) and Labs Only (wants psa. lipid and renal)  Patient is a 69 year old male who presents for a lab exam. The patient reports the following problems: tickbite. Health maintenance has been reviewed needs labs  Rash This is a new (s/p tick bite) problem. The current episode started more than 1 month ago. The problem is unchanged. The affected locations include the right arm. Rash characteristics: scar formation.    Lab Results  Component Value Date   CREATININE 1.14 03/15/2019   BUN 12 03/15/2019   NA 137 03/15/2019   K 4.6 03/15/2019   CL 102 03/15/2019   CO2 25 03/15/2019   Lab Results  Component Value Date   CHOL 234 (H) 03/15/2019   HDL 55 03/15/2019   LDLCALC 158 (H) 03/15/2019   TRIG 105 03/15/2019   CHOLHDL 4.3 03/15/2019   Lab Results  Component Value Date   TSH 1.010 04/14/2019   No results found for: HGBA1C Lab Results  Component Value Date   WBC 6.4 04/14/2019   HGB 11.6 (L) 04/14/2019   HCT 34.0 (L) 04/14/2019   MCV 90 04/14/2019   PLT 506 (H) 04/14/2019   Lab Results  Component Value Date   ALT 38 04/14/2019   AST 31 04/14/2019   ALKPHOS 59 04/14/2019   BILITOT 0.3 04/14/2019     Review of Systems  Skin: Positive for rash.    Patient Active Problem List   Diagnosis Date Noted  . Aortic atherosclerosis ([place]) 12/15/2018  . Glaucoma 03/09/2017  . Diverticulosis 02/14/2015  . Irritable bowel syndrome (IBS) 02/14/2015  . Hx of iron deficiency anemia 02/14/2015  . Hyperlipemia intolerant statins 02/14/2015  . Vitamin D deficiency 02/14/2015  . Erectile dysfunction 02/14/2015  . Ureteral stricture s/p stent 02/14/2015    Allergies  Allergen Reactions  . Crestor [Rosuvastatin]     kidney pain    Past Surgical History:  Procedure Laterality Date  . URETHRA SURGERY  1990's    stent placement    Social History   Tobacco Use  . Smoking status: Never Smoker  . Smokeless tobacco: Never Used  Vaping Use  . Vaping Use: Never used  Substance Use Topics  . Alcohol use: Not Currently    Alcohol/week: 0.0 standard drinks  . Drug use: No     Medication list has been reviewed and updated.  Current Meds  Medication Sig  . aspirin EC 81 MG tablet Take 81 mg by mouth daily.  . Cholecalciferol (VITAMIN D3) 5000 units CAPS Take 1 capsule by mouth daily.  Marland Kitchen latanoprost (XALATAN) 0.005 % ophthalmic solution Place 1 drop into both eyes at bedtime.  . Multiple Vitamin (MULTIVITAMIN) tablet Take 1 tablet by mouth daily.  . Omega-3 Fatty Acids (FISH OIL CONCENTRATE PO) Take by mouth.  . timolol (TIMOPTIC) 0.25 % ophthalmic solution Place 1 drop into both eyes every morning.  . vitamin E (VITAMIN E) 180 MG (400 UNITS) capsule Take 400 Units by mouth daily.    PHQ 2/9 Scores 04/22/2020 03/11/2020 03/09/2019 01/11/2019  PHQ - 2 Score 0 0 0 0  PHQ- 9 Score - - 0 0    GAD 7 : Generalized Anxiety Score 04/22/2020  Nervous, Anxious, on Edge 0  Control/stop worrying 0  Worry too much -  different things 0  Trouble relaxing 0  Restless 0  Easily annoyed or irritable 0  Afraid - awful might happen 0  Total GAD 7 Score 0    BP Readings from Last 3 Encounters:  04/22/20 120/64  03/11/20 130/80  03/11/20 130/80    Physical Exam  Wt Readings from Last 3 Encounters:  04/22/20 186 lb (84.4 kg)  03/11/20 186 lb (84.4 kg)  03/11/20 186 lb (84.4 kg)    BP 120/64   Pulse 60   Ht 6\' 3"  (1.905 m)   Wt 186 lb (84.4 kg)   BMI 23.25 kg/m   Assessment and Plan:  1. Keloid of skin New onset.  Stable.  Area of where the tick bit him several months ago with formation of a keloid.  2. Hx of iron deficiency anemia She has history of iron deficiency anemia with mild anemia on the last CBC and we will recheck the status thereof. - CBC with Differential/Platelet  3. Familial  hypercholesterolemia Chronic.  Controlled.  Stable.  We will check lipid panel and adjust diet accordingly. - Lipid Panel With LDL/HDL Ratio  4. Statin intolerance Patient has history of statin intolerance unable to take Crestor in the past.  Patient will be controlling with dietary approach  5. Nocturia Occasional nocturia we will check PSA for status. - PSA  6. Hypoalbuminemia Patient with history of hypoalbuminemia and will recheck renal function panel with hepatic function panel. - Hepatic Function Panel (6) - Renal Function Panel  7. Aortic atherosclerosis (HCC) Chronic.  Stable.  Will control patient's blood pressure, cholesterol, and weight.

## 2020-04-23 DIAGNOSIS — E7801 Familial hypercholesterolemia: Secondary | ICD-10-CM | POA: Diagnosis not present

## 2020-04-23 DIAGNOSIS — R351 Nocturia: Secondary | ICD-10-CM | POA: Diagnosis not present

## 2020-04-23 DIAGNOSIS — E8809 Other disorders of plasma-protein metabolism, not elsewhere classified: Secondary | ICD-10-CM | POA: Diagnosis not present

## 2020-04-23 DIAGNOSIS — Z862 Personal history of diseases of the blood and blood-forming organs and certain disorders involving the immune mechanism: Secondary | ICD-10-CM | POA: Diagnosis not present

## 2020-04-24 LAB — LIPID PANEL WITH LDL/HDL RATIO
Cholesterol, Total: 256 mg/dL — ABNORMAL HIGH (ref 100–199)
HDL: 61 mg/dL (ref >39)
LDL Chol Calc (NIH): 182 mg/dL — ABNORMAL HIGH (ref 0–99)
LDL/HDL Ratio: 3 ratio (ref 0.0–3.6)
Triglycerides: 80 mg/dL (ref 0–149)
VLDL Cholesterol Cal: 13 mg/dL (ref 5–40)

## 2020-04-24 LAB — RENAL FUNCTION PANEL
Albumin: 4.2 g/dL (ref 3.8–4.8)
BUN/Creatinine Ratio: 14 (ref 10–24)
BUN: 16 mg/dL (ref 8–27)
CO2: 22 mmol/L (ref 20–29)
Calcium: 9.2 mg/dL (ref 8.6–10.2)
Chloride: 106 mmol/L (ref 96–106)
Creatinine, Ser: 1.13 mg/dL (ref 0.76–1.27)
GFR calc Af Amer: 76 mL/min/{1.73_m2} (ref >59)
GFR calc non Af Amer: 66 mL/min/{1.73_m2} (ref >59)
Glucose: 89 mg/dL (ref 65–99)
Phosphorus: 3.5 mg/dL (ref 2.8–4.1)
Potassium: 4.3 mmol/L (ref 3.5–5.2)
Sodium: 142 mmol/L (ref 134–144)

## 2020-04-24 LAB — CBC WITH DIFFERENTIAL/PLATELET
Basophils Absolute: 0 10*3/uL (ref 0.0–0.2)
Basos: 0 %
EOS (ABSOLUTE): 0.2 10*3/uL (ref 0.0–0.4)
Eos: 3 %
Hematocrit: 39 % (ref 37.5–51.0)
Hemoglobin: 13.6 g/dL (ref 13.0–17.7)
Immature Grans (Abs): 0 10*3/uL (ref 0.0–0.1)
Immature Granulocytes: 0 %
Lymphocytes Absolute: 2 10*3/uL (ref 0.7–3.1)
Lymphs: 35 %
MCH: 31.6 pg (ref 26.6–33.0)
MCHC: 34.9 g/dL (ref 31.5–35.7)
MCV: 91 fL (ref 79–97)
Monocytes Absolute: 0.5 10*3/uL (ref 0.1–0.9)
Monocytes: 8 %
Neutrophils Absolute: 3 10*3/uL (ref 1.4–7.0)
Neutrophils: 54 %
Platelets: 295 10*3/uL (ref 150–450)
RBC: 4.31 x10E6/uL (ref 4.14–5.80)
RDW: 12.2 % (ref 11.6–15.4)
WBC: 5.6 10*3/uL (ref 3.4–10.8)

## 2020-04-24 LAB — HEPATIC FUNCTION PANEL (6)
ALT: 17 IU/L (ref 0–44)
AST: 23 IU/L (ref 0–40)
Alkaline Phosphatase: 48 IU/L (ref 48–121)
Bilirubin Total: 0.7 mg/dL (ref 0.0–1.2)
Bilirubin, Direct: 0.15 mg/dL (ref 0.00–0.40)

## 2020-04-24 LAB — PSA: Prostate Specific Ag, Serum: 0.3 ng/mL (ref 0.0–4.0)

## 2020-04-25 ENCOUNTER — Other Ambulatory Visit: Payer: Self-pay

## 2020-04-25 DIAGNOSIS — E7801 Familial hypercholesterolemia: Secondary | ICD-10-CM

## 2020-04-25 MED ORDER — EZETIMIBE 10 MG PO TABS: 10.0000 mg | ORAL_TABLET | Freq: Every day | ORAL | 1 refills | Status: DC

## 2020-04-30 ENCOUNTER — Telehealth: Payer: Self-pay | Admitting: Family Medicine

## 2020-04-30 NOTE — Telephone Encounter (Signed)
Called pt with appt for Patrick Mcknight and left appt time as well as left number to Mcknight's office on vm

## 2020-04-30 NOTE — Telephone Encounter (Signed)
Pt called back and was advised of his appt.  Also connected pt to [facility] at [facility]. Nothing further needed.

## 2020-04-30 NOTE — Telephone Encounter (Signed)
Copied from CRM [id number]. Topic: General - Other >> Apr 30, 2020 11:45 AM Wyonia Hough E wrote: Reason for CRM: Pt was scheduled for a colonoscopy on Friday and doesn't know what time/ Pt also stated he hasnt heard anything about the solution being called in that he needs prior to the colonoscopy/ please advise asap

## 2020-05-03 DIAGNOSIS — B9681 Helicobacter pylori [H. pylori] as the cause of diseases classified elsewhere: Secondary | ICD-10-CM | POA: Diagnosis not present

## 2020-05-03 DIAGNOSIS — Z01818 Encounter for other preprocedural examination: Secondary | ICD-10-CM | POA: Diagnosis not present

## 2020-05-03 DIAGNOSIS — K2970 Gastritis, unspecified, without bleeding: Secondary | ICD-10-CM | POA: Diagnosis not present

## 2020-05-03 DIAGNOSIS — Z1211 Encounter for screening for malignant neoplasm of colon: Secondary | ICD-10-CM | POA: Diagnosis not present

## 2020-05-06 DIAGNOSIS — K2970 Gastritis, unspecified, without bleeding: Secondary | ICD-10-CM | POA: Diagnosis not present

## 2020-05-06 DIAGNOSIS — B9681 Helicobacter pylori [H. pylori] as the cause of diseases classified elsewhere: Secondary | ICD-10-CM | POA: Diagnosis not present

## 2020-05-21 ENCOUNTER — Telehealth: Payer: Self-pay | Admitting: Family Medicine

## 2020-05-21 ENCOUNTER — Other Ambulatory Visit: Payer: Medicare Other

## 2020-05-21 NOTE — Telephone Encounter (Signed)
Copied from CRM [id number]. Topic: Appointment Scheduling - Scheduling Inquiry for Clinic >> May 21, 2020  3:40 PM Randol Kern wrote: Reason for CRM: Pt called to report that he has been instructed to call the office for cholesterol labs. He is down to 3 pills and would like to be contacted for scheduling. Best contact: [phone]

## 2020-05-21 NOTE — Telephone Encounter (Signed)
Please call him- he doesn't need an appt, come in tomorrow for lab only. If you want, go ahead and put on schedule and hit arrived and I will put lab sheet up front

## 2020-05-22 ENCOUNTER — Other Ambulatory Visit: Payer: Medicare Other

## 2020-05-23 ENCOUNTER — Other Ambulatory Visit: Payer: Medicare Other

## 2020-05-23 DIAGNOSIS — R69 Illness, unspecified: Secondary | ICD-10-CM

## 2020-05-23 DIAGNOSIS — E782 Mixed hyperlipidemia: Secondary | ICD-10-CM

## 2020-05-23 NOTE — Progress Notes (Signed)
Printed off lab order

## 2020-05-24 DIAGNOSIS — R69 Illness, unspecified: Secondary | ICD-10-CM | POA: Diagnosis not present

## 2020-05-24 DIAGNOSIS — E782 Mixed hyperlipidemia: Secondary | ICD-10-CM | POA: Diagnosis not present

## 2020-05-25 LAB — HEPATIC FUNCTION PANEL
ALT: 26 IU/L (ref 0–44)
AST: 29 IU/L (ref 0–40)
Albumin: 4.3 g/dL (ref 3.8–4.8)
Alkaline Phosphatase: 50 IU/L (ref 48–121)
Bilirubin Total: 0.8 mg/dL (ref 0.0–1.2)
Bilirubin, Direct: 0.22 mg/dL (ref 0.00–0.40)
Total Protein: 7 g/dL (ref 6.0–8.5)

## 2020-05-25 LAB — LIPID PANEL WITH LDL/HDL RATIO
Cholesterol, Total: 222 mg/dL — ABNORMAL HIGH (ref 100–199)
HDL: 61 mg/dL (ref >39)
LDL Chol Calc (NIH): 152 mg/dL — ABNORMAL HIGH (ref 0–99)
LDL/HDL Ratio: 2.5 ratio (ref 0.0–3.6)
Triglycerides: 55 mg/dL (ref 0–149)
VLDL Cholesterol Cal: 9 mg/dL (ref 5–40)

## 2020-06-04 DIAGNOSIS — Z01818 Encounter for other preprocedural examination: Secondary | ICD-10-CM | POA: Diagnosis not present

## 2020-06-06 DIAGNOSIS — K640 First degree hemorrhoids: Secondary | ICD-10-CM | POA: Diagnosis not present

## 2020-06-06 DIAGNOSIS — Z1211 Encounter for screening for malignant neoplasm of colon: Secondary | ICD-10-CM | POA: Diagnosis not present

## 2020-06-06 LAB — HM COLONOSCOPY

## 2020-06-24 ENCOUNTER — Other Ambulatory Visit: Payer: Self-pay | Admitting: Family Medicine

## 2020-06-24 DIAGNOSIS — E7801 Familial hypercholesterolemia: Secondary | ICD-10-CM

## 2020-09-09 DIAGNOSIS — N5201 Erectile dysfunction due to arterial insufficiency: Secondary | ICD-10-CM | POA: Diagnosis not present

## 2020-09-09 DIAGNOSIS — R300 Dysuria: Secondary | ICD-10-CM | POA: Diagnosis not present

## 2020-12-02 DIAGNOSIS — Z125 Encounter for screening for malignant neoplasm of prostate: Secondary | ICD-10-CM | POA: Diagnosis not present

## 2020-12-02 DIAGNOSIS — N401 Enlarged prostate with lower urinary tract symptoms: Secondary | ICD-10-CM | POA: Diagnosis not present

## 2020-12-05 DIAGNOSIS — H2513 Age-related nuclear cataract, bilateral: Secondary | ICD-10-CM | POA: Diagnosis not present

## 2020-12-05 DIAGNOSIS — H5203 Hypermetropia, bilateral: Secondary | ICD-10-CM | POA: Diagnosis not present

## 2020-12-05 DIAGNOSIS — H401221 Low-tension glaucoma, left eye, mild stage: Secondary | ICD-10-CM | POA: Diagnosis not present

## 2020-12-05 DIAGNOSIS — H401212 Low-tension glaucoma, right eye, moderate stage: Secondary | ICD-10-CM | POA: Diagnosis not present

## 2020-12-11 DIAGNOSIS — Z125 Encounter for screening for malignant neoplasm of prostate: Secondary | ICD-10-CM | POA: Diagnosis not present

## 2020-12-11 DIAGNOSIS — N401 Enlarged prostate with lower urinary tract symptoms: Secondary | ICD-10-CM | POA: Diagnosis not present

## 2020-12-11 DIAGNOSIS — N5201 Erectile dysfunction due to arterial insufficiency: Secondary | ICD-10-CM | POA: Diagnosis not present

## 2020-12-12 DIAGNOSIS — H5203 Hypermetropia, bilateral: Secondary | ICD-10-CM | POA: Diagnosis not present

## 2020-12-12 DIAGNOSIS — H401212 Low-tension glaucoma, right eye, moderate stage: Secondary | ICD-10-CM | POA: Diagnosis not present

## 2020-12-12 DIAGNOSIS — H2513 Age-related nuclear cataract, bilateral: Secondary | ICD-10-CM | POA: Diagnosis not present

## 2020-12-12 DIAGNOSIS — H401221 Low-tension glaucoma, left eye, mild stage: Secondary | ICD-10-CM | POA: Diagnosis not present

## 2021-02-06 IMAGING — CR CHEST - 2 VIEW
2 series · 3 of 3 positions shown · non-contrast
Comparison: None.

CLINICAL DATA: Night sweats and fever for 2-3 weeks.

EXAM:
CHEST - 2 VIEW

[Series 1: chest pa · 0.14mm/px · 2 of 2 slices shown]
[im 1/2]
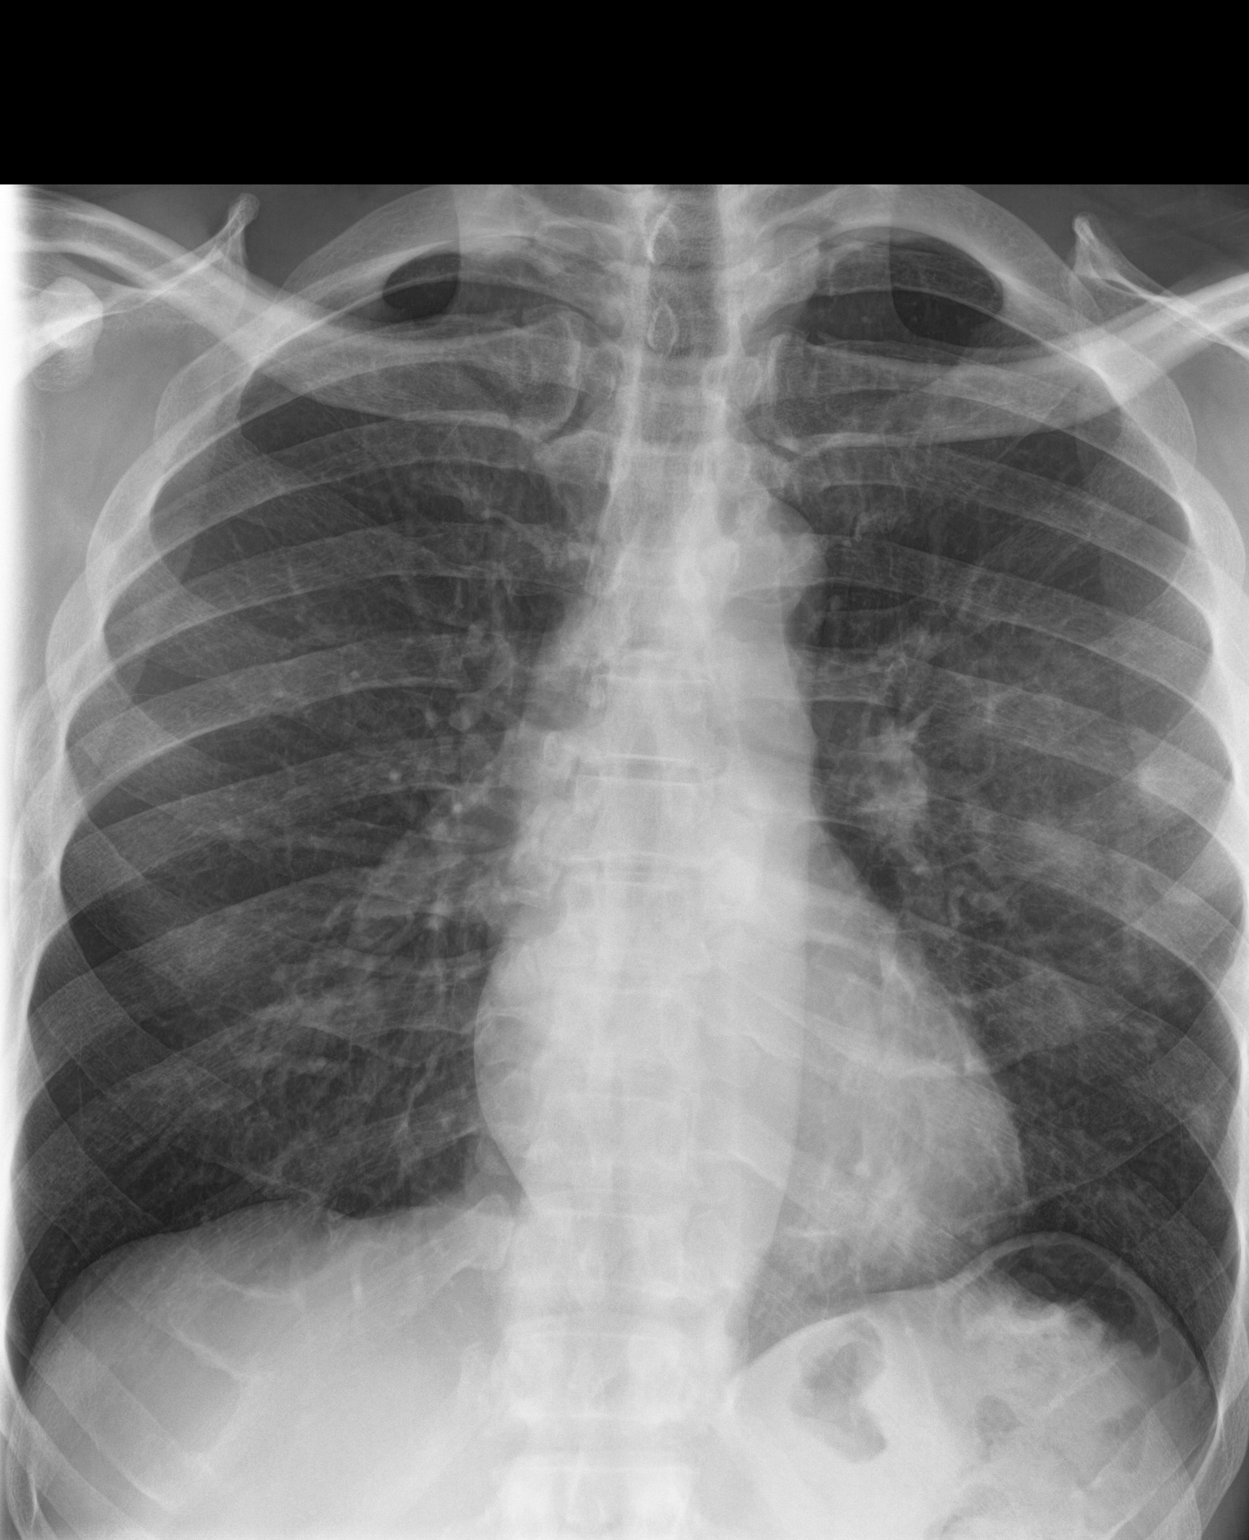
[im 2/2]
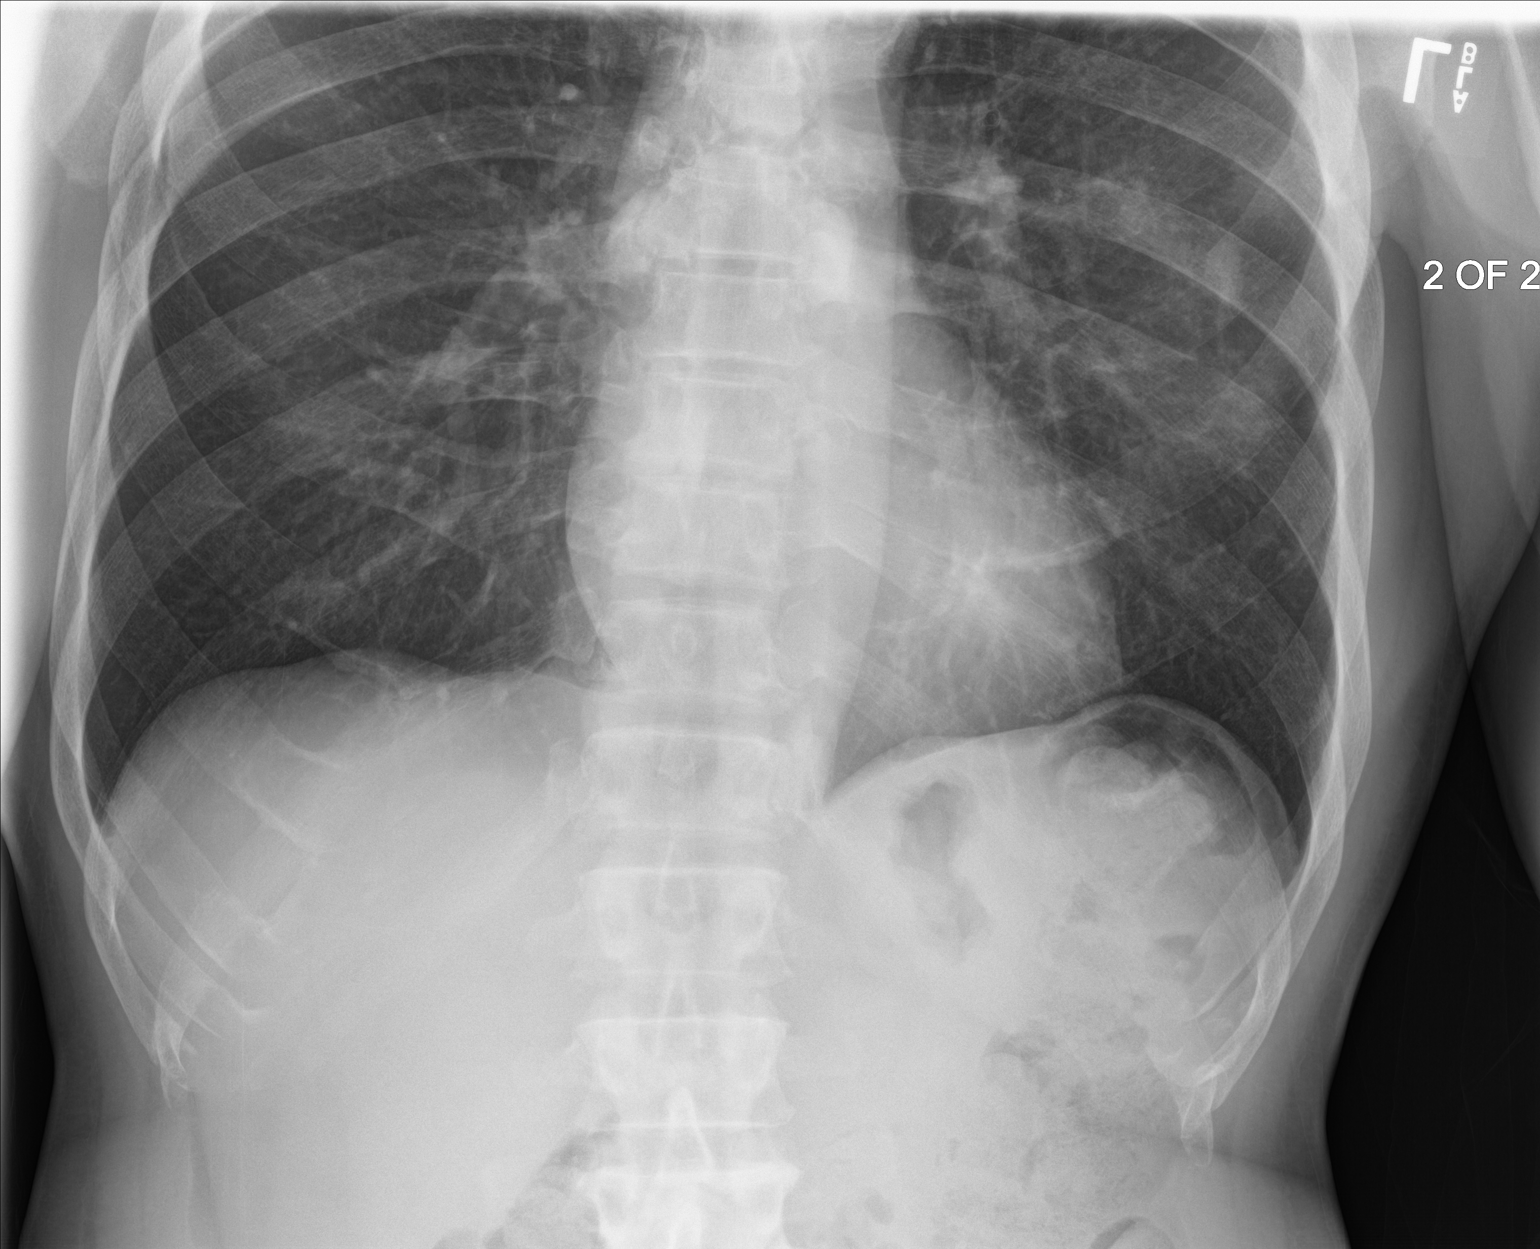

[chest lat]
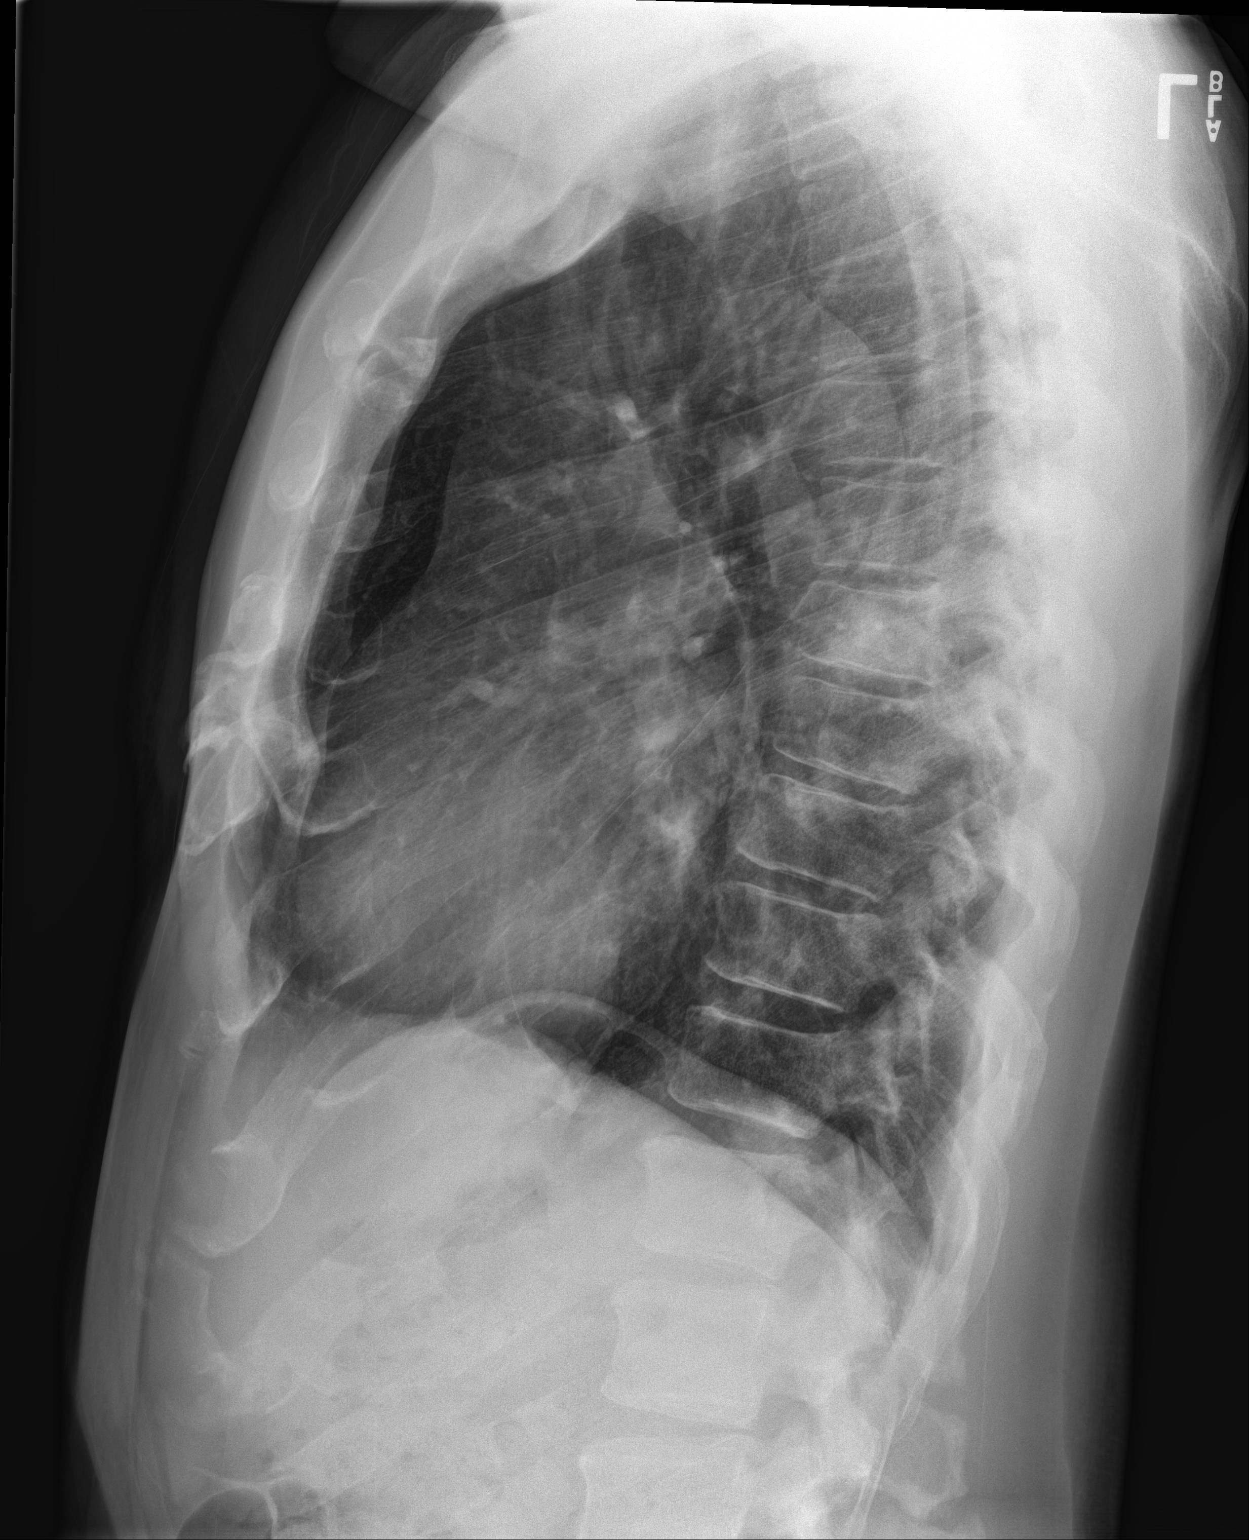

[3 of 3 positions shown; findings below may reference images not displayed]

FINDINGS: Subtle nodular opacities within the LEFT mid lung region. RIGHT lung
is clear. No pleural effusion or pneumothorax seen. Heart size and
mediastinal contours are within normal limits. Osseous structures
about the chest are unremarkable.
IMPRESSION: Subtle ill-defined nodular opacities within the LEFT mid lung
region, suspicious for pneumonia. Neoplastic process not excluded.
Would consider chest CT for more definitive characterization.

## 2021-03-10 ENCOUNTER — Telehealth: Payer: Self-pay | Admitting: Family Medicine

## 2021-03-10 NOTE — Telephone Encounter (Signed)
Copied from CRM [id number]. Topic: Medicare AWV >> Mar 10, 2021 11:48 AM Claudette Laws R wrote: Reason for CRM:   Left message for patient to call back and schedule Medicare Annual Wellness Visit (AWV) in office.   If unable to come into the office for AWV,  please offer to do virtually or by telephone.  Last AWV:   03/11/2020  Please schedule at anytime with [facility] Health Advisor.  40 minute appointment  Any questions, please contact me at [phone]

## 2021-03-12 ENCOUNTER — Ambulatory Visit (INDEPENDENT_AMBULATORY_CARE_PROVIDER_SITE_OTHER): Payer: Medicare Other

## 2021-03-12 DIAGNOSIS — H401221 Low-tension glaucoma, left eye, mild stage: Secondary | ICD-10-CM | POA: Diagnosis not present

## 2021-03-12 DIAGNOSIS — Z0000 Encounter for general adult medical examination without abnormal findings: Secondary | ICD-10-CM | POA: Diagnosis not present

## 2021-03-12 DIAGNOSIS — H401212 Low-tension glaucoma, right eye, moderate stage: Secondary | ICD-10-CM | POA: Diagnosis not present

## 2021-03-12 DIAGNOSIS — H2513 Age-related nuclear cataract, bilateral: Secondary | ICD-10-CM | POA: Diagnosis not present

## 2021-03-12 DIAGNOSIS — H5203 Hypermetropia, bilateral: Secondary | ICD-10-CM | POA: Diagnosis not present

## 2021-03-12 NOTE — Patient Instructions (Signed)
Patrick Mcknight , Thank you for taking time to come for your Medicare Wellness Visit. I appreciate your ongoing commitment to your health goals. Please review the following plan we discussed and let me know if I can assist you in the future.   Screening recommendations/referrals: Colonoscopy: done 06/06/20 Recommended yearly ophthalmology/optometry visit for glaucoma screening and checkup Recommended yearly dental visit for hygiene and checkup  Vaccinations: Influenza vaccine: done 08/22/20 Pneumococcal vaccine: done 03/09/17 Tdap vaccine: due Shingles vaccine: Shingrix discussed. Please contact your pharmacy for coverage information.  Covid-19:  Done 12/08/19, 12/29/19, 08/22/20 & 02/10/21  Advanced directives: Please bring a copy of your health care power of attorney and living will to the office at your convenience once you have completed those documents.   Conditions/risks identified: Keep up the great work!  Next appointment: Follow up in one year for your annual wellness visit.   Preventive Care 70 Years and Older, Male Preventive care refers to lifestyle choices and visits with your health care provider that can promote health and wellness. What does preventive care include?  A yearly physical exam. This is also called an annual well check.  Dental exams once or twice a year.  Routine eye exams. Ask your health care provider how often you should have your eyes checked.  Personal lifestyle choices, including:  Daily care of your teeth and gums.  Regular physical activity.  Eating a healthy diet.  Avoiding tobacco and drug use.  Limiting alcohol use.  Practicing safe sex.  Taking low doses of aspirin every day.  Taking vitamin and mineral supplements as recommended by your health care provider. What happens during an annual well check? The services and screenings done by your health care provider during your annual well check will depend on your age, overall health,  lifestyle risk factors, and family history of disease. Counseling  Your health care provider may ask you questions about your:  Alcohol use.  Tobacco use.  Drug use.  Emotional well-being.  Home and relationship well-being.  Sexual activity.  Eating habits.  History of falls.  Memory and ability to understand (cognition).  Work and work Astronomer. Screening  You may have the following tests or measurements:  Height, weight, and BMI.  Blood pressure.  Lipid and cholesterol levels. These may be checked every 5 years, or more frequently if you are over 84 years old.  Skin check.  Lung cancer screening. You may have this screening every year starting at age 68 if you have a 30-pack-year history of smoking and currently smoke or have quit within the past 15 years.  Fecal occult blood test (FOBT) of the stool. You may have this test every year starting at age 38.  Flexible sigmoidoscopy or colonoscopy. You may have a sigmoidoscopy every 5 years or a colonoscopy every 10 years starting at age 35.  Prostate cancer screening. Recommendations will vary depending on your family history and other risks.  Hepatitis C blood test.  Hepatitis B blood test.  Sexually transmitted disease (STD) testing.  Diabetes screening. This is done by checking your blood sugar (glucose) after you have not eaten for a while (fasting). You may have this done every 1-3 years.  Abdominal aortic aneurysm (AAA) screening. You may need this if you are a current or former smoker.  Osteoporosis. You may be screened starting at age 66 if you are at high risk. Talk with your health care provider about your test results, treatment options, and if necessary, the need for  more tests. Vaccines  Your health care provider may recommend certain vaccines, such as:  Influenza vaccine. This is recommended every year.  Tetanus, diphtheria, and acellular pertussis (Tdap, Td) vaccine. You may need a Td booster  every 10 years.  Zoster vaccine. You may need this after age 46.  Pneumococcal 13-valent conjugate (PCV13) vaccine. One dose is recommended after age 2.  Pneumococcal polysaccharide (PPSV23) vaccine. One dose is recommended after age 62. Talk to your health care provider about which screenings and vaccines you need and how often you need them. This information is not intended to replace advice given to you by your health care provider. Make sure you discuss any questions you have with your health care provider. Document Released: 11/08/2015 Document Revised: 07/01/2016 Document Reviewed: 08/13/2015 Elsevier Interactive Patient Education  2017 [employer] Prevention in the Home Falls can cause injuries. They can happen to people of all ages. There are many things you can do to make your home safe and to help prevent falls. What can I do on the outside of my home?  Regularly fix the edges of walkways and driveways and fix any cracks.  Remove anything that might make you trip as you walk through a door, such as a raised step or threshold.  Trim any bushes or trees on the path to your home.  Use bright outdoor lighting.  Clear any walking paths of anything that might make someone trip, such as rocks or tools.  Regularly check to see if handrails are loose or broken. Make sure that both sides of any steps have handrails.  Any raised decks and porches should have guardrails on the edges.  Have any leaves, snow, or ice cleared regularly.  Use sand or salt on walking paths during winter.  Clean up any spills in your garage right away. This includes oil or grease spills. What can I do in the bathroom?  Use night lights.  Install grab bars by the toilet and in the tub and shower. Do not use towel bars as grab bars.  Use non-skid mats or decals in the tub or shower.  If you need to sit down in the shower, use a plastic, non-slip stool.  Keep the floor dry. Clean up any  water that spills on the floor as soon as it happens.  Remove soap buildup in the tub or shower regularly.  Attach bath mats securely with double-sided non-slip rug tape.  Do not have throw rugs and other things on the floor that can make you trip. What can I do in the bedroom?  Use night lights.  Make sure that you have a light by your bed that is easy to reach.  Do not use any sheets or blankets that are too big for your bed. They should not hang down onto the floor.  Have a firm chair that has side arms. You can use this for support while you get dressed.  Do not have throw rugs and other things on the floor that can make you trip. What can I do in the kitchen?  Clean up any spills right away.  Avoid walking on wet floors.  Keep items that you use a lot in easy-to-reach places.  If you need to reach something above you, use a strong step stool that has a grab bar.  Keep electrical cords out of the way.  Do not use floor polish or wax that makes floors slippery. If you must use wax, use non-skid  floor wax.  Do not have throw rugs and other things on the floor that can make you trip. What can I do with my stairs?  Do not leave any items on the stairs.  Make sure that there are handrails on both sides of the stairs and use them. Fix handrails that are broken or loose. Make sure that handrails are as long as the stairways.  Check any carpeting to make sure that it is firmly attached to the stairs. Fix any carpet that is loose or worn.  Avoid having throw rugs at the top or bottom of the stairs. If you do have throw rugs, attach them to the floor with carpet tape.  Make sure that you have a light switch at the top of the stairs and the bottom of the stairs. If you do not have them, ask someone to add them for you. What else can I do to help prevent falls?  Wear shoes that:  Do not have high heels.  Have rubber bottoms.  Are comfortable and fit you well.  Are closed  at the toe. Do not wear sandals.  If you use a stepladder:  Make sure that it is fully opened. Do not climb a closed stepladder.  Make sure that both sides of the stepladder are locked into place.  Ask someone to hold it for you, if possible.  Clearly mark and make sure that you can see:  Any grab bars or handrails.  First and last steps.  Where the edge of each step is.  Use tools that help you move around (mobility aids) if they are needed. These include:  Canes.  Walkers.  Scooters.  Crutches.  Turn on the lights when you go into a dark area. Replace any light bulbs as soon as they burn out.  Set up your furniture so you have a clear path. Avoid moving your furniture around.  If any of your floors are uneven, fix them.  If there are any pets around you, be aware of where they are.  Review your medicines with your doctor. Some medicines can make you feel dizzy. This can increase your chance of falling. Ask your doctor what other things that you can do to help prevent falls. This information is not intended to replace advice given to you by your health care provider. Make sure you discuss any questions you have with your health care provider. Document Released: 08/08/2009 Document Revised: 03/19/2016 Document Reviewed: 11/16/2014 Elsevier Interactive Patient Education  2017 [employer].

## 2021-03-12 NOTE — Progress Notes (Signed)
Subjective:   Patrick Mcknight is a 70 y.o. male who presents for Medicare Annual/Subsequent preventive examination.  Virtual Visit via Telephone Note  I connected with  Patrick Mcknight on 03/12/21 at  8:40 AM EDT by telephone and verified that I am speaking with the correct person using two identifiers.  Location: Patient: home Provider: [facility] Mcknight Persons participating in the virtual visit: patient/Nurse Health Advisor   I discussed the limitations, risks, security and privacy concerns of performing an evaluation and management service by telephone and the availability of in person appointments. The patient expressed understanding and agreed to proceed.  Interactive audio and video telecommunications were attempted between this nurse and patient, however failed, due to patient having technical difficulties OR patient did not have access to video capability.  We continued and completed visit with audio only.  Some vital signs may be absent or patient reported.   Patrick LittlerKasey Jakera Beaupre, LPN    Review of Systems     Cardiac Risk Factors include: advanced age (>3555men, 82>65 women);male gender;dyslipidemia;hypertension     Objective:    There were no vitals filed for this visit. There is no height or weight on file to calculate BMI.  Advanced Directives 03/12/2021 03/11/2020  Does Patient Have a Medical Advance Directive? No No  Does patient want to make changes to medical advance directive? No - Patient declined -  Would patient like information on creating a medical advance directive? - Yes (MAU/Ambulatory/Procedural Areas - Information given)    Current Medications (verified) Outpatient Encounter Medications as of 03/12/2021  Medication Sig  . aspirin EC 81 MG tablet Take 81 mg by mouth daily.  . brimonidine (ALPHAGAN) 0.2 % ophthalmic solution SMARTSIG:In Eye(s)  . Cholecalciferol (VITAMIN D3) 5000 units CAPS Take 1 capsule by mouth daily.  Marland Kitchen. latanoprost (XALATAN) 0.005 % ophthalmic solution  Place 1 drop into both eyes at bedtime.  . Multiple Vitamin (MULTIVITAMIN) tablet Take 1 tablet by mouth daily.  . Omega-3 Fatty Acids (FISH OIL CONCENTRATE PO) Take by mouth.  . timolol (TIMOPTIC) 0.25 % ophthalmic solution Place 1 drop into both eyes every morning.  . vitamin E 180 MG (400 UNITS) capsule Take 400 Units by mouth daily.  . [DISCONTINUED] ezetimibe (ZETIA) 10 MG tablet TAKE 1 TABLET(10 MG) BY MOUTH DAILY   No facility-administered encounter medications on file as of 03/12/2021.    Allergies (verified) Crestor [rosuvastatin]   History: Past Medical History:  Diagnosis Date  . Erectile dysfunction   . GERD (gastroesophageal reflux disease)    Past Surgical History:  Procedure Laterality Date  . URETHRA SURGERY  1990's   stent placement   Family History  Family history unknown: Yes   Social History   Socioeconomic History  . Marital status: Married    Spouse name: Not on file  . Number of children: 2  . Years of education: Not on file  . Highest education level: Not on file  Occupational History  . Not on file  Tobacco Use  . Smoking status: Never Smoker  . Smokeless tobacco: Never Used  Vaping Use  . Vaping Use: Never used  Substance and Sexual Activity  . Alcohol use: Not Currently    Alcohol/week: 0.0 standard drinks  . Drug use: No  . Sexual activity: Yes  Other Topics Concern  . Not on file  Social History Narrative   2 adopted children.    Social Determinants of Health   Financial Resource Strain: Low Risk   . Difficulty  of Paying Living Expenses: Not hard at all  Food Insecurity: No Food Insecurity  . Worried About Programme researcher, broadcasting/film/video in the Last Year: Never true  . Ran Out of Food in the Last Year: Never true  Transportation Needs: No Transportation Needs  . Lack of Transportation (Medical): No  . Lack of Transportation (Non-Medical): No  Physical Activity: Sufficiently Active  . Days of Exercise per Week: 6 days  . Minutes of  Exercise per Session: 60 min  Stress: No Stress Concern Present  . Feeling of Stress : Not at all  Social Connections: Moderately Integrated  . Frequency of Communication with Friends and Family: More than three times a week  . Frequency of Social Gatherings with Friends and Family: More than three times a week  . Attends Religious Services: More than 4 times per year  . Active Member of Clubs or Organizations: No  . Attends Banker Meetings: Never  . Marital Status: Married    Tobacco Counseling Counseling given: Not Answered   Clinical Intake:  Pre-visit preparation completed: Yes  Pain : No/denies pain     Nutritional Risks: None Diabetes: No  How often do you need to have someone help you when you read instructions, pamphlets, or other written materials from your doctor or pharmacy?: 1 - Never    Interpreter Needed?: No  Information entered by :: Patrick Littler LPN   Activities of Daily Living In your present state of health, do you have any difficulty performing the following activities: 03/12/2021  Hearing? Y  Comment declines hearing aids  Vision? N  Difficulty concentrating or making decisions? N  Walking or climbing stairs? N  Dressing or bathing? N  Doing errands, shopping? N  Preparing Food and eating ? N  Using the Toilet? N  In the past six months, have you accidently leaked urine? N  Do you have problems with loss of bowel control? N  Managing your Medications? N  Managing your Finances? N  Housekeeping or managing your Housekeeping? N  Some recent data might be hidden    Patient Care Team: Patrick Limerick, MD as PCP - General (Family Medicine) Patrick Ape, MD as Consulting Physician (Urology)  Indicate any recent Medical Services you may have received from other than [facility] providers in the past year (date may be approximate).     Assessment:   This is a routine wellness examination for Patrick Mcknight.  Hearing/Vision screen   Hearing Screening   125Hz  250Hz  500Hz  1000Hz  2000Hz  3000Hz  4000Hz  6000Hz  8000Hz   Right ear:           Left ear:           Comments: Pt states mild hearing difficulty; declines hearing aids   Vision Screening Comments: Annual vision screenings done by Dr.  Dietary issues and exercise activities discussed: Current Exercise Habits: Home exercise routine, Type of exercise: calisthenics;strength training/weights;walking, Time (Minutes): 60, Frequency (Times/Week): 5, Weekly Exercise (Minutes/Week): 300, Intensity: Moderate, Exercise limited by: None identified  Goals Addressed            This Visit's Progress   . Patient Stated       Patient states he would like to remain healthy and active.       Depression Screen PHQ 2/9 Scores 03/12/2021 04/22/2020 03/11/2020 03/09/2019 01/11/2019 03/09/2017 03/10/2016  PHQ - 2 Score 0 0 0 0 0 0 0  PHQ- 9 Score - - - 0 0 - -  Fall Risk Fall Risk  03/12/2021 04/22/2020 03/11/2020 12/15/2018 03/09/2017  Falls in the past year? 0 0 0 0 No  Number falls in past yr: 0 - 0 0 -  Injury with Fall? 0 - 0 0 -  Risk for fall due to : No Fall Risks - No Fall Risks - -  Follow up Falls prevention discussed Falls evaluation completed Falls prevention discussed - -    FALL RISK PREVENTION PERTAINING TO THE HOME:  Any stairs in or around the home? Yes  If so, are there any without handrails? No  Home free of loose throw rugs in walkways, pet beds, electrical cords, etc? Yes  Adequate lighting in your home to reduce risk of falls? Yes   ASSISTIVE DEVICES UTILIZED TO PREVENT FALLS:  Life alert? No  Use of a cane, walker or w/c? No  Grab bars in the bathroom? Yes  Shower chair or bench in shower? Yes  Elevated toilet seat or a handicapped toilet? Yes   TIMED UP AND GO:  Was the test performed? No . Telephonic visit.   Cognitive Function: Normal cognitive status assessed by direct observation by this Nurse Health Advisor. No abnormalities found.           Immunizations Immunization History  Administered Date(s) Administered  . Fluad Quad(high Dose 65+) 08/01/2019  . Influenza, High Dose Seasonal PF 08/22/2020  . PFIZER(Purple Top)SARS-COV-2 Vaccination 12/08/2019, 12/29/2019, 08/22/2020, 02/10/2021  . Pneumococcal Conjugate-13 03/10/2016  . Pneumococcal Polysaccharide-23 03/09/2017    TDAP status: Due, Education has been provided regarding the importance of this vaccine. Advised may receive this vaccine at local pharmacy or Health Dept. Aware to provide a copy of the vaccination record if obtained from local pharmacy or Health Dept. Verbalized acceptance and understanding.  Flu Vaccine status: Up to date  Pneumococcal vaccine status: Up to date  Covid-19 vaccine status: Completed vaccines  Qualifies for Shingles Vaccine? Yes   Zostavax completed No   Shingrix Completed?: No.    Education has been provided regarding the importance of this vaccine. Patient has been advised to call insurance company to determine out of pocket expense if they have not yet received this vaccine. Advised may also receive vaccine at local pharmacy or Health Dept. Verbalized acceptance and understanding.  Screening Tests Health Maintenance  Topic Date Due  . INFLUENZA VACCINE  05/26/2021  . TETANUS/TDAP  10/26/2022  . COLONOSCOPY (Pts 45-77yrs Insurance coverage will need to be confirmed)  06/06/2030  . COVID-19 Vaccine  Completed  . Hepatitis C Screening  Completed  . PNA vac Low Risk Adult  Completed  . HPV VACCINES  Aged Out    Health Maintenance  There are no preventive care reminders to display for this patient.  Colorectal cancer screening: Type of screening: Colonoscopy. Completed 06/06/20. Repeat every 10 years  Lung Cancer Screening: (Low Dose CT Chest recommended if Age 45-80 years, 30 pack-year currently smoking OR have quit w/in 15years.) does not qualify.   Additional Screening:  Hepatitis C Screening: does qualify; Completed  03/07/18  Vision Screening: Recommended annual ophthalmology exams for early detection of glaucoma and other disorders of the eye. Is the patient up to date with their annual eye exam?  Yes  Who is the provider or what is the name of the office in which the patient attends annual eye exams? Dr,. Nice.   Dental Screening: Recommended annual dental exams for proper oral hygiene  Community Resource Referral / Chronic Care Management: CRR required this visit?  No   CCM required this visit?  No      Plan:     I have personally reviewed and noted the following in the patient's chart:   . Medical and social history . Use of alcohol, tobacco or illicit drugs  . Current medications and supplements including opioid prescriptions. Patient is not currently taking opioid prescriptions. . Functional ability and status . Nutritional status . Physical activity . Advanced directives . List of other physicians . Hospitalizations, surgeries, and ER visits in previous 12 months . Vitals . Screenings to include cognitive, depression, and falls . Referrals and appointments  In addition, I have reviewed and discussed with patient certain preventive protocols, quality metrics, and best practice recommendations. A written personalized care plan for preventive services as well as general preventive health recommendations were provided to patient.     Patrick Littler, LPN   [id number]   Nurse Notes: none

## 2021-03-13 ENCOUNTER — Encounter: Payer: Self-pay | Admitting: Family Medicine

## 2021-03-13 ENCOUNTER — Ambulatory Visit (INDEPENDENT_AMBULATORY_CARE_PROVIDER_SITE_OTHER): Payer: Medicare Other | Admitting: Family Medicine

## 2021-03-13 ENCOUNTER — Other Ambulatory Visit: Payer: Self-pay

## 2021-03-13 VITALS — BP 118/78 | HR 60 | Ht 75.0 in | Wt 183.0 lb

## 2021-03-13 DIAGNOSIS — I700 Atherosclerosis of aorta: Secondary | ICD-10-CM | POA: Diagnosis not present

## 2021-03-13 DIAGNOSIS — Z1211 Encounter for screening for malignant neoplasm of colon: Secondary | ICD-10-CM | POA: Diagnosis not present

## 2021-03-13 DIAGNOSIS — R351 Nocturia: Secondary | ICD-10-CM | POA: Diagnosis not present

## 2021-03-13 DIAGNOSIS — Z862 Personal history of diseases of the blood and blood-forming organs and certain disorders involving the immune mechanism: Secondary | ICD-10-CM

## 2021-03-13 DIAGNOSIS — Z0000 Encounter for general adult medical examination without abnormal findings: Secondary | ICD-10-CM | POA: Diagnosis not present

## 2021-03-13 DIAGNOSIS — E7801 Familial hypercholesterolemia: Secondary | ICD-10-CM | POA: Diagnosis not present

## 2021-03-13 DIAGNOSIS — E8809 Other disorders of plasma-protein metabolism, not elsewhere classified: Secondary | ICD-10-CM | POA: Diagnosis not present

## 2021-03-13 NOTE — Progress Notes (Signed)
Date:  03/13/2021   Name:  SAYYID HAREWOOD   DOB:  11-28-1950   MRN:  [id number]   Chief Complaint: Annual Exam  Patient is a 70 year old male who presents for a comprehensive physical exam. The patient reports the following problems: none. Health maintenance has been reviewed up to date.   Lab Results  Component Value Date   CREATININE 1.13 04/23/2020   BUN 16 04/23/2020   NA 142 04/23/2020   K 4.3 04/23/2020   CL 106 04/23/2020   CO2 22 04/23/2020   Lab Results  Component Value Date   CHOL 222 (H) 05/24/2020   HDL 61 05/24/2020   LDLCALC 152 (H) 05/24/2020   TRIG 55 05/24/2020   CHOLHDL 4.3 03/15/2019   Lab Results  Component Value Date   TSH 1.010 04/14/2019   No results found for: HGBA1C Lab Results  Component Value Date   WBC 5.6 04/23/2020   HGB 13.6 04/23/2020   HCT 39.0 04/23/2020   MCV 91 04/23/2020   PLT 295 04/23/2020   Lab Results  Component Value Date   ALT 26 05/24/2020   AST 29 05/24/2020   ALKPHOS 50 05/24/2020   BILITOT 0.8 05/24/2020     Review of Systems  Constitutional: Negative for chills, fatigue, fever and unexpected weight change.  HENT: Negative for drooling, ear discharge, ear pain and sore throat.   Respiratory: Negative for cough, shortness of breath and wheezing.   Cardiovascular: Negative for chest pain, palpitations and leg swelling.  Gastrointestinal: Negative for abdominal pain, blood in stool, constipation, diarrhea and nausea.  Endocrine: Negative for polydipsia, polyphagia and polyuria.  Genitourinary: Negative for decreased urine volume, dysuria, flank pain, frequency, hematuria and urgency.  Musculoskeletal: Negative for back pain, myalgias and neck pain.  Skin: Negative for color change, pallor and rash.  Allergic/Immunologic: Negative for environmental allergies.  Neurological: Negative for dizziness, weakness and headaches.  Hematological: Does not bruise/bleed easily.  Psychiatric/Behavioral: Negative for  suicidal ideas. The patient is not nervous/anxious.     Patient Active Problem List   Diagnosis Date Noted  . Aortic atherosclerosis (HCC) 12/15/2018  . Glaucoma 03/09/2017  . Diverticulosis 02/14/2015  . Irritable bowel syndrome (IBS) 02/14/2015  . Hx of iron deficiency anemia 02/14/2015  . Hyperlipemia intolerant statins 02/14/2015  . Vitamin D deficiency 02/14/2015  . Erectile dysfunction 02/14/2015  . Ureteral stricture s/p stent 02/14/2015    Allergies  Allergen Reactions  . Crestor [Rosuvastatin]     kidney pain    Past Surgical History:  Procedure Laterality Date  . URETHRA SURGERY  1990's   stent placement    Social History   Tobacco Use  . Smoking status: Never Smoker  . Smokeless tobacco: Never Used  Vaping Use  . Vaping Use: Never used  Substance Use Topics  . Alcohol use: Not Currently    Alcohol/week: 0.0 standard drinks  . Drug use: No     Medication list has been reviewed and updated.  Current Meds  Medication Sig  . aspirin EC 81 MG tablet Take 81 mg by mouth daily.  . brimonidine (ALPHAGAN) 0.2 % ophthalmic solution SMARTSIG:In Eye(s)  . Cholecalciferol (VITAMIN D3) 5000 units CAPS Take 1 capsule by mouth daily.  Marland Kitchen latanoprost (XALATAN) 0.005 % ophthalmic solution Place 1 drop into both eyes at bedtime.  . Multiple Vitamin (MULTIVITAMIN) tablet Take 1 tablet by mouth daily.  . Omega-3 Fatty Acids (FISH OIL CONCENTRATE PO) Take by mouth.  . timolol (  TIMOPTIC) 0.25 % ophthalmic solution Place 1 drop into both eyes every morning.  . vitamin E 180 MG (400 UNITS) capsule Take 400 Units by mouth daily.    PHQ 2/9 Scores 03/13/2021 03/12/2021 04/22/2020 03/11/2020  PHQ - 2 Score 0 0 0 0  PHQ- 9 Score 0 - - -    GAD 7 : Generalized Anxiety Score 03/13/2021 04/22/2020  Nervous, Anxious, on Edge 0 0  Control/stop worrying 0 0  Worry too much - different things 0 0  Trouble relaxing 0 0  Restless 0 0  Easily annoyed or irritable 0 0  Afraid - awful  might happen 0 0  Total GAD 7 Score 0 0  Anxiety Difficulty Not difficult at all -    BP Readings from Last 3 Encounters:  03/13/21 118/78  04/22/20 120/64  03/11/20 130/80    Physical Exam Vitals and nursing note reviewed.  Constitutional:      Appearance: Normal appearance. He is well-developed, well-groomed and normal weight.  HENT:     Head: Normocephalic.     Jaw: There is normal jaw occlusion.     Right Ear: Hearing, tympanic membrane, ear canal and external ear normal. There is no impacted cerumen.     Left Ear: Hearing, tympanic membrane, ear canal and external ear normal. There is no impacted cerumen.     Nose: Nose normal. No congestion or rhinorrhea.     Mouth/Throat:     Mouth: Mucous membranes are moist.     Pharynx: No oropharyngeal exudate or posterior oropharyngeal erythema.  Eyes:     General: Lids are normal. Vision grossly intact. Gaze aligned appropriately. No visual field deficit or scleral icterus.       Right eye: No discharge.        Left eye: No discharge.     Extraocular Movements: Extraocular movements intact.     Conjunctiva/sclera: Conjunctivae normal.     Pupils: Pupils are equal, round, and reactive to light.     Funduscopic exam:    Right eye: Red reflex present.        Left eye: Red reflex present. Neck:     Thyroid: No thyroid mass, thyromegaly or thyroid tenderness.     Vascular: Normal carotid pulses. No carotid bruit, hepatojugular reflux or JVD.     Trachea: No tracheal deviation.  Cardiovascular:     Rate and Rhythm: Normal rate and regular rhythm.     Chest Wall: PMI is not displaced.     Pulses: Normal pulses.     Heart sounds: Normal heart sounds, S1 normal and S2 normal. No murmur heard.  No systolic murmur is present.  No diastolic murmur is present. No friction rub. No gallop. No S3 or S4 sounds.   Pulmonary:     Effort: Pulmonary effort is normal. No respiratory distress.     Breath sounds: Normal breath sounds. No  decreased breath sounds, wheezing, rhonchi or rales.  Chest:     Chest wall: No tenderness.  Breasts:     Right: Normal. No mass, axillary adenopathy or supraclavicular adenopathy.     Left: Normal. No mass, axillary adenopathy or supraclavicular adenopathy.    Abdominal:     General: Bowel sounds are normal.     Palpations: Abdomen is soft. There is no hepatomegaly, splenomegaly or mass.     Tenderness: There is no abdominal tenderness. There is no right CVA tenderness, left CVA tenderness, guarding or rebound.  Genitourinary:    Penis: Normal  and uncircumcised.      Testes: Normal.        Right: Mass, tenderness or swelling not present.        Left: Mass, tenderness or swelling not present.     Epididymis:     Right: Normal.     Left: Normal.     Prostate: Normal.  Musculoskeletal:        General: No tenderness. Normal range of motion.     Cervical back: Full passive range of motion without pain, normal range of motion and neck supple.     Right lower leg: No edema.     Left lower leg: No edema.  Lymphadenopathy:     Cervical: No cervical adenopathy.     Right cervical: No superficial, deep or posterior cervical adenopathy.    Left cervical: No superficial, deep or posterior cervical adenopathy.     Upper Body:     Right upper body: No supraclavicular or axillary adenopathy.     Left upper body: No supraclavicular or axillary adenopathy.  Skin:    General: Skin is warm.     Capillary Refill: Capillary refill takes less than 2 seconds.     Findings: No bruising, erythema or rash.  Neurological:     Mental Status: He is alert and oriented to person, place, and time.     Cranial Nerves: Cranial nerves are intact. No cranial nerve deficit, dysarthria or facial asymmetry.     Sensory: Sensation is intact. No sensory deficit.     Motor: Motor function is intact. No weakness or tremor.     Deep Tendon Reflexes: Reflexes are normal and symmetric.  Psychiatric:        Behavior:  Behavior is cooperative.     Wt Readings from Last 3 Encounters:  03/13/21 183 lb (83 kg)  04/22/20 186 lb (84.4 kg)  03/11/20 186 lb (84.4 kg)    BP 118/78   Pulse 60   Ht 6\' 3"  (1.905 m)   Wt 183 lb (83 kg)   BMI 22.87 kg/m  s. Assessment and Plan:                                                                    Hessie DibbleWilliam L Incorvaia is a 70 y.o. male who presents today for his Complete Annual Exam. He feels well. He reports exercising . He reports he is sleeping well.  Patient's chart was reviewed from previous exams most recent labs most recent imaging and care everywhere.  1. Annual physical exam No subjective/objective concerns noted during history and physical exam.Immunizations are reviewed and recommendations provided.   Age appropriate screening tests are discussed. Counseling given for risk factor reduction interventions.  Lab work including lipid panel, renal panel, and hepatic function panel drawn. - Lipid Panel With LDL/HDL Ratio - Renal Function Panel - Hepatic Function Panel (6)  2. Colon cancer screening Colonoscopy was done 06/06/2020 was unremarkable.  3. Aortic atherosclerosis (HCC) Patient with history of aortic atherosclerosis.  We will control this by controlling blood pressure, lipid, and weight control. - Renal Function Panel  4. Familial hypercholesterolemia Chronic.  Controlled.  Stable.  Patient is controlling this with diet.  Will check lipid panel - Lipid Panel With LDL/HDL Ratio -  Hepatic Function Panel (6)  5. Hypoalbuminemia Patient with history of hypoalbuminemia we will check renal function panel. - Renal Function Panel  6. Hx of iron deficiency anemia With history of iron deficiency anemia we will check CBC with differential. - CBC with Differential/Platelet  7. Nocturia Patient has occasional nocturia.  DRE was done that was unremarkable for increased size nodularity or abnormal consistency.  Will check PSA. - PSA

## 2021-03-14 ENCOUNTER — Other Ambulatory Visit: Payer: Self-pay

## 2021-03-14 DIAGNOSIS — E7801 Familial hypercholesterolemia: Secondary | ICD-10-CM

## 2021-03-14 LAB — CBC WITH DIFFERENTIAL/PLATELET
Basophils Absolute: 0 10*3/uL (ref 0.0–0.2)
Basos: 1 %
EOS (ABSOLUTE): 0.4 10*3/uL (ref 0.0–0.4)
Eos: 6 %
Hematocrit: 41.5 % (ref 37.5–51.0)
Hemoglobin: 14.7 g/dL (ref 13.0–17.7)
Immature Grans (Abs): 0 10*3/uL (ref 0.0–0.1)
Immature Granulocytes: 0 %
Lymphocytes Absolute: 2.5 10*3/uL (ref 0.7–3.1)
Lymphs: 42 %
MCH: 32 pg (ref 26.6–33.0)
MCHC: 35.4 g/dL (ref 31.5–35.7)
MCV: 90 fL (ref 79–97)
Monocytes Absolute: 0.5 10*3/uL (ref 0.1–0.9)
Monocytes: 8 %
Neutrophils Absolute: 2.6 10*3/uL (ref 1.4–7.0)
Neutrophils: 43 %
Platelets: 287 10*3/uL (ref 150–450)
RBC: 4.59 x10E6/uL (ref 4.14–5.80)
RDW: 12.6 % (ref 11.6–15.4)
WBC: 5.9 10*3/uL (ref 3.4–10.8)

## 2021-03-14 LAB — HEPATIC FUNCTION PANEL (6)
ALT: 21 IU/L (ref 0–44)
AST: 29 IU/L (ref 0–40)
Alkaline Phosphatase: 53 IU/L (ref 44–121)
Bilirubin Total: 0.8 mg/dL (ref 0.0–1.2)
Bilirubin, Direct: 0.16 mg/dL (ref 0.00–0.40)

## 2021-03-14 LAB — RENAL FUNCTION PANEL
Albumin: 4.4 g/dL (ref 3.8–4.8)
BUN/Creatinine Ratio: 14 (ref 10–24)
BUN: 16 mg/dL (ref 8–27)
CO2: 22 mmol/L (ref 20–29)
Calcium: 9.3 mg/dL (ref 8.6–10.2)
Chloride: 104 mmol/L (ref 96–106)
Creatinine, Ser: 1.14 mg/dL (ref 0.76–1.27)
Glucose: 86 mg/dL (ref 65–99)
Phosphorus: 3.4 mg/dL (ref 2.8–4.1)
Potassium: 4.4 mmol/L (ref 3.5–5.2)
Sodium: 141 mmol/L (ref 134–144)
eGFR: 69 mL/min/{1.73_m2} (ref >59)

## 2021-03-14 LAB — LIPID PANEL WITH LDL/HDL RATIO
Cholesterol, Total: 270 mg/dL — ABNORMAL HIGH (ref 100–199)
HDL: 64 mg/dL (ref >39)
LDL Chol Calc (NIH): 193 mg/dL — ABNORMAL HIGH (ref 0–99)
LDL/HDL Ratio: 3 ratio (ref 0.0–3.6)
Triglycerides: 81 mg/dL (ref 0–149)
VLDL Cholesterol Cal: 13 mg/dL (ref 5–40)

## 2021-03-14 LAB — PSA: Prostate Specific Ag, Serum: 0.5 ng/mL (ref 0.0–4.0)

## 2021-03-14 MED ORDER — EZETIMIBE 10 MG PO TABS: 10.0000 mg | ORAL_TABLET | Freq: Every day | ORAL | 1 refills | Status: DC

## 2021-03-14 NOTE — Progress Notes (Unsigned)
Sent in Zetia 

## 2021-05-10 ENCOUNTER — Other Ambulatory Visit: Payer: Self-pay | Admitting: Family Medicine

## 2021-05-10 DIAGNOSIS — E7801 Familial hypercholesterolemia: Secondary | ICD-10-CM

## 2021-05-10 NOTE — Telephone Encounter (Signed)
Requested Prescriptions  Pending Prescriptions Disp Refills  . ezetimibe (ZETIA) 10 MG tablet [Pharmacy Med Name: EZETIMIBE 10MG  TABLETS] 90 tablet 2    Sig: TAKE 1 TABLET(10 MG) BY MOUTH DAILY     Cardiovascular:  Antilipid - Sterol Transport Inhibitors Failed - 05/10/2021  2:26 PM      Failed - Total Cholesterol in normal range and within 360 days    Cholesterol, Total  Date Value Ref Range Status  03/13/2021 270 (H) 100 - 199 mg/dL Final         Failed - LDL in normal range and within 360 days    LDL Chol Calc (NIH)  Date Value Ref Range Status  03/13/2021 193 (H) 0 - 99 mg/dL Final         Passed - HDL in normal range and within 360 days    HDL  Date Value Ref Range Status  03/13/2021 64 >39 mg/dL Final         Passed - Triglycerides in normal range and within 360 days    Triglycerides  Date Value Ref Range Status  03/13/2021 81 0 - 149 mg/dL Final         Passed - Valid encounter within last 12 months    Recent Outpatient Visits          1 month ago Annual physical exam   [facility] 03/15/2021, MD   1 year ago Keloid of skin   [facility] Duanne Limerick, MD   1 year ago Annual physical exam   [facility]   2 years ago Night sweats   [facility] Duanne Limerick, MD   2 years ago Fever and chills   [facility] Duanne Limerick, MD      Future Appointments            In 2 months Duanne Limerick, MD [facility]

## 2021-05-26 DIAGNOSIS — H401121 Primary open-angle glaucoma, left eye, mild stage: Secondary | ICD-10-CM | POA: Insufficient documentation

## 2021-05-26 DIAGNOSIS — H2513 Age-related nuclear cataract, bilateral: Secondary | ICD-10-CM | POA: Diagnosis not present

## 2021-05-26 DIAGNOSIS — H401112 Primary open-angle glaucoma, right eye, moderate stage: Secondary | ICD-10-CM | POA: Diagnosis not present

## 2021-06-16 DIAGNOSIS — H2513 Age-related nuclear cataract, bilateral: Secondary | ICD-10-CM | POA: Diagnosis not present

## 2021-06-16 DIAGNOSIS — H5203 Hypermetropia, bilateral: Secondary | ICD-10-CM | POA: Diagnosis not present

## 2021-06-16 DIAGNOSIS — H401221 Low-tension glaucoma, left eye, mild stage: Secondary | ICD-10-CM | POA: Diagnosis not present

## 2021-06-16 DIAGNOSIS — H401212 Low-tension glaucoma, right eye, moderate stage: Secondary | ICD-10-CM | POA: Diagnosis not present

## 2021-07-16 ENCOUNTER — Other Ambulatory Visit: Payer: Self-pay

## 2021-07-16 ENCOUNTER — Ambulatory Visit (INDEPENDENT_AMBULATORY_CARE_PROVIDER_SITE_OTHER): Payer: Medicare Other | Admitting: Family Medicine

## 2021-07-16 ENCOUNTER — Encounter: Payer: Self-pay | Admitting: Family Medicine

## 2021-07-16 VITALS — BP 120/62 | HR 64 | Ht 75.0 in | Wt 180.0 lb

## 2021-07-16 DIAGNOSIS — E7801 Familial hypercholesterolemia: Secondary | ICD-10-CM

## 2021-07-16 DIAGNOSIS — L910 Hypertrophic scar: Secondary | ICD-10-CM | POA: Diagnosis not present

## 2021-07-16 MED ORDER — EZETIMIBE 10 MG PO TABS: ORAL_TABLET | ORAL | 1 refills | Status: DC

## 2021-07-16 NOTE — Progress Notes (Signed)
Date:  07/16/2021   Name:  Patrick Mcknight   DOB:  April 09, 1951   MRN:  [id number]   Chief Complaint: Hyperlipidemia and place on arm  Hyperlipidemia This is a chronic problem. The current episode started more than 1 year ago. The problem is controlled. Recent lipid tests were reviewed and are normal. He has no history of chronic renal disease, diabetes, hypothyroidism, liver disease, obesity or nephrotic syndrome. Factors aggravating his hyperlipidemia include thiazides. Pertinent negatives include no chest pain, focal sensory loss, focal weakness, leg pain, myalgias or shortness of breath. Current antihyperlipidemic treatment includes ezetimibe. The current treatment provides moderate improvement of lipids. There are no compliance problems.  There are no known risk factors for coronary artery disease.   Lab Results  Component Value Date   CREATININE 1.14 03/13/2021   BUN 16 03/13/2021   NA 141 03/13/2021   K 4.4 03/13/2021   CL 104 03/13/2021   CO2 22 03/13/2021   Lab Results  Component Value Date   CHOL 270 (H) 03/13/2021   HDL 64 03/13/2021   LDLCALC 193 (H) 03/13/2021   TRIG 81 03/13/2021   CHOLHDL 4.3 03/15/2019   Lab Results  Component Value Date   TSH 1.010 04/14/2019   No results found for: HGBA1C Lab Results  Component Value Date   WBC 5.9 03/13/2021   HGB 14.7 03/13/2021   HCT 41.5 03/13/2021   MCV 90 03/13/2021   PLT 287 03/13/2021   Lab Results  Component Value Date   ALT 21 03/13/2021   AST 29 03/13/2021   ALKPHOS 53 03/13/2021   BILITOT 0.8 03/13/2021     Review of Systems  Constitutional:  Negative for chills, fever and unexpected weight change.  HENT:  Negative for drooling, ear discharge, ear pain and sore throat.   Respiratory:  Negative for cough, shortness of breath and wheezing.   Cardiovascular:  Negative for chest pain, palpitations and leg swelling.  Gastrointestinal:  Negative for abdominal pain, blood in stool, constipation, diarrhea  and nausea.  Endocrine: Negative for polydipsia.  Genitourinary:  Negative for dysuria, frequency, hematuria and urgency.  Musculoskeletal:  Negative for back pain, myalgias and neck pain.  Skin:  Negative for rash.  Allergic/Immunologic: Negative for environmental allergies.  Neurological:  Negative for dizziness, focal weakness and headaches.  Hematological:  Does not bruise/bleed easily.  Psychiatric/Behavioral:  Negative for suicidal ideas. The patient is not nervous/anxious.    Patient Active Problem List   Diagnosis Date Noted   Aortic atherosclerosis (HCC) 12/15/2018   Glaucoma 03/09/2017   Diverticulosis 02/14/2015   Irritable bowel syndrome (IBS) 02/14/2015   Hx of iron deficiency anemia 02/14/2015   Hyperlipemia intolerant statins 02/14/2015   Vitamin D deficiency 02/14/2015   Erectile dysfunction 02/14/2015   Ureteral stricture s/p stent 02/14/2015    Allergies  Allergen Reactions   Crestor [Rosuvastatin]     kidney pain    Past Surgical History:  Procedure Laterality Date   URETHRA SURGERY  1990's   stent placement    Social History   Tobacco Use   Smoking status: Never   Smokeless tobacco: Never  Vaping Use   Vaping Use: Never used  Substance Use Topics   Alcohol use: Not Currently    Alcohol/week: 0.0 standard drinks   Drug use: No     Medication list has been reviewed and updated.  Current Meds  Medication Sig   aspirin EC 81 MG tablet Take 81 mg by mouth daily.  brimonidine (ALPHAGAN) 0.2 % ophthalmic solution SMARTSIG:In Eye(s)   Cholecalciferol (VITAMIN D3) 5000 units CAPS Take 1 capsule by mouth daily.   ezetimibe (ZETIA) 10 MG tablet TAKE 1 TABLET(10 MG) BY MOUTH DAILY   latanoprost (XALATAN) 0.005 % ophthalmic solution Place 1 drop into both eyes at bedtime.   Multiple Vitamin (MULTIVITAMIN) tablet Take 1 tablet by mouth daily.   Omega-3 Fatty Acids (FISH OIL CONCENTRATE PO) Take by mouth.   timolol (TIMOPTIC) 0.25 % ophthalmic  solution Place 1 drop into both eyes every morning.   vitamin E 180 MG (400 UNITS) capsule Take 400 Units by mouth daily.    PHQ 2/9 Scores 07/16/2021 03/13/2021 03/12/2021 04/22/2020  PHQ - 2 Score 0 0 0 0  PHQ- 9 Score 0 0 - -    GAD 7 : Generalized Anxiety Score 07/16/2021 03/13/2021 04/22/2020  Nervous, Anxious, on Edge 0 0 0  Control/stop worrying 0 0 0  Worry too much - different things 0 0 0  Trouble relaxing 0 0 0  Restless 0 0 0  Easily annoyed or irritable 0 0 0  Afraid - awful might happen 0 0 0  Total GAD 7 Score 0 0 0  Anxiety Difficulty - Not difficult at all -    BP Readings from Last 3 Encounters:  07/16/21 120/62  03/13/21 118/78  04/22/20 120/64    Physical Exam Vitals and nursing note reviewed.  HENT:     Right Ear: Tympanic membrane and ear canal normal.     Left Ear: Tympanic membrane and ear canal normal.     Nose: Nose normal.  Eyes:     Pupils: Pupils are equal, round, and reactive to light.  Pulmonary:     Breath sounds: No wheezing or rhonchi.    Wt Readings from Last 3 Encounters:  07/16/21 180 lb (81.6 kg)  03/13/21 183 lb (83 kg)  04/22/20 186 lb (84.4 kg)    BP 120/62   Pulse 64   Ht 6\' 3"  (1.905 m)   Wt 180 lb (81.6 kg)   BMI 22.50 kg/m   Assessment and Plan:  1. Familial hypercholesterolemia Chronic.  Controlled.  Stable.  Blood pressure 120/62.  Continue Zetia 10 mg once a day.  Check lipid panel for current LDL control. - ezetimibe (ZETIA) 10 MG tablet; TAKE 1 TABLET(10 MG) BY MOUTH DAILY  Dispense: 90 tablet; Refill: 1 - Lipid Panel With LDL/HDL Ratio  2. Keloid of skin Chronic.  Persistent.  Unresolved and has patient concerned.  Likely was a tick bite that got secondarily infected and scarred and keloid formed.  We will send to dermatology for evaluation.  Referral placed for Dr. . - Ambulatory referral to Dermatology

## 2021-07-17 LAB — LIPID PANEL WITH LDL/HDL RATIO
Cholesterol, Total: 235 mg/dL — ABNORMAL HIGH (ref 100–199)
HDL: 65 mg/dL (ref >39)
LDL Chol Calc (NIH): 160 mg/dL — ABNORMAL HIGH (ref 0–99)
LDL/HDL Ratio: 2.5 ratio (ref 0.0–3.6)
Triglycerides: 58 mg/dL (ref 0–149)
VLDL Cholesterol Cal: 10 mg/dL (ref 5–40)

## 2021-07-29 DIAGNOSIS — D485 Neoplasm of uncertain behavior of skin: Secondary | ICD-10-CM | POA: Diagnosis not present

## 2021-08-13 ENCOUNTER — Other Ambulatory Visit: Payer: Self-pay | Admitting: Family Medicine

## 2021-08-13 DIAGNOSIS — E7801 Familial hypercholesterolemia: Secondary | ICD-10-CM

## 2021-08-13 NOTE — Telephone Encounter (Signed)
Requested Prescriptions  Pending Prescriptions Disp Refills  . ezetimibe (ZETIA) 10 MG tablet [Pharmacy Med Name: EZETIMIBE 10MG  TABLETS] 90 tablet 1    Sig: TAKE 1 TABLET(10 MG) BY MOUTH DAILY     Cardiovascular:  Antilipid - Sterol Transport Inhibitors Failed - 08/13/2021  3:16 AM      Failed - Total Cholesterol in normal range and within 360 days    Cholesterol, Total  Date Value Ref Range Status  07/16/2021 235 (H) 100 - 199 mg/dL Final         Failed - LDL in normal range and within 360 days    LDL Chol Calc (NIH)  Date Value Ref Range Status  07/16/2021 160 (H) 0 - 99 mg/dL Final         Passed - HDL in normal range and within 360 days    HDL  Date Value Ref Range Status  07/16/2021 65 >39 mg/dL Final         Passed - Triglycerides in normal range and within 360 days    Triglycerides  Date Value Ref Range Status  07/16/2021 58 0 - 149 mg/dL Final         Passed - Valid encounter within last 12 months    Recent Outpatient Visits          4 weeks ago Familial hypercholesterolemia   [facility] 07/18/2021, MD   5 months ago Annual physical exam   [facility]   1 year ago Keloid of skin   [facility] Duanne Limerick, MD   1 year ago Annual physical exam   [facility]   2 years ago Night sweats   [facility] Duanne Limerick, MD      Future Appointments            In 5 months Duanne Limerick, MD [facility], [facility]

## 2021-08-26 ENCOUNTER — Other Ambulatory Visit: Payer: Self-pay | Admitting: Family Medicine

## 2021-08-26 DIAGNOSIS — E7801 Familial hypercholesterolemia: Secondary | ICD-10-CM

## 2021-08-26 NOTE — Telephone Encounter (Signed)
Call to pharmacy- Rx on file they will fill and have ready for pick up tomorrow call to patient- left message with wife.

## 2021-08-26 NOTE — Telephone Encounter (Signed)
Medication Refill - Medication:  ezetimibe (ZETIA) 10 MG tablet   Has the patient contacted their pharmacy? Yes.   Contact PCP  Preferred Pharmacy (with phone number or street name):  [id number] [place]ree D[place], [place] - [address] AT [facility] [address] [facility]  [address] [place], [place] [address]  Phone:  [phone]  Fax:  [phone]  Has the patient been seen for an appointment in the last year OR does the patient have an upcoming appointment? Yes.    Agent: Please be advised that RX refills may take up to 3 business days. We ask that you follow-up with your pharmacy.

## 2021-08-26 NOTE — Telephone Encounter (Signed)
Call to pharmacy- on hold with 3 caller before me- call to patient- he states he was told rx not there- will call pharmacy after lunchtime- maybe they will not be so busy.

## 2021-09-09 DIAGNOSIS — Q279 Congenital malformation of peripheral vascular system, unspecified: Secondary | ICD-10-CM | POA: Diagnosis not present

## 2021-09-09 DIAGNOSIS — D485 Neoplasm of uncertain behavior of skin: Secondary | ICD-10-CM | POA: Diagnosis not present

## 2021-09-15 DIAGNOSIS — H401221 Low-tension glaucoma, left eye, mild stage: Secondary | ICD-10-CM | POA: Diagnosis not present

## 2021-09-15 DIAGNOSIS — H2513 Age-related nuclear cataract, bilateral: Secondary | ICD-10-CM | POA: Diagnosis not present

## 2021-09-15 DIAGNOSIS — H401212 Low-tension glaucoma, right eye, moderate stage: Secondary | ICD-10-CM | POA: Diagnosis not present

## 2021-09-15 DIAGNOSIS — H5203 Hypermetropia, bilateral: Secondary | ICD-10-CM | POA: Diagnosis not present

## 2021-11-04 DIAGNOSIS — L905 Scar conditions and fibrosis of skin: Secondary | ICD-10-CM | POA: Diagnosis not present

## 2021-11-04 DIAGNOSIS — R898 Other abnormal findings in specimens from other organs, systems and tissues: Secondary | ICD-10-CM | POA: Diagnosis not present

## 2021-12-01 ENCOUNTER — Other Ambulatory Visit: Payer: Self-pay | Admitting: Family Medicine

## 2021-12-01 DIAGNOSIS — E7801 Familial hypercholesterolemia: Secondary | ICD-10-CM

## 2021-12-01 NOTE — Telephone Encounter (Signed)
Copied from CRM [id number]. Topic: Quick Communication - Rx Refill/Question >> Dec 01, 2021 10:04 AM Gaetana Michaelis A wrote: Medication: ezetimibe (ZETIA) 10 MG tablet [[id number]]  Has the patient contacted their pharmacy? No. The patient was directed previously to cal their PCP (Agent: If no, request that the patient contact the pharmacy for the refill. If patient does not wish to contact the pharmacy document the reason why and proceed with request.) (Agent: If yes, when and what did the pharmacy advise?)  Preferred Pharmacy (with phone number or street name): [facility][id number] [place]ree Ditto, [place] - [address] AT [place] OF [address] [facility] [address] [place] [address] Phone: [phone] Fax: [phone]   Has the patient been seen for an appointment in the last year OR does the patient have an upcoming appointment? Yes.    Agent: Please be advised that RX refills may take up to 3 business days. We ask that you follow-up with your pharmacy.

## 2021-12-02 MED ORDER — EZETIMIBE 10 MG PO TABS: ORAL_TABLET | ORAL | 1 refills | Status: DC

## 2021-12-02 NOTE — Telephone Encounter (Signed)
Requested Prescriptions  Pending Prescriptions Disp Refills   ezetimibe (ZETIA) 10 MG tablet 90 tablet 1    Sig: TAKE 1 TABLET(10 MG) BY MOUTH DAILY     Cardiovascular:  Antilipid - Sterol Transport Inhibitors Failed - 12/01/2021  6:10 PM      Failed - Lipid Panel in normal range within the last 12 months    Cholesterol, Total  Date Value Ref Range Status  07/16/2021 235 (H) 100 - 199 mg/dL Final   LDL Chol Calc (NIH)  Date Value Ref Range Status  07/16/2021 160 (H) 0 - 99 mg/dL Final   HDL  Date Value Ref Range Status  07/16/2021 65 >39 mg/dL Final   Triglycerides  Date Value Ref Range Status  07/16/2021 58 0 - 149 mg/dL Final         Passed - AST in normal range and within 360 days    AST  Date Value Ref Range Status  03/13/2021 29 0 - 40 IU/L Final         Passed - ALT in normal range and within 360 days    ALT  Date Value Ref Range Status  03/13/2021 21 0 - 44 IU/L Final         Passed - Patient is not pregnant      Passed - Valid encounter within last 12 months    Recent Outpatient Visits          4 months ago Familial hypercholesterolemia   [facility] Duanne Limerick, MD   8 months ago Annual physical exam   [facility] Duanne Limerick, MD   1 year ago Keloid of skin   [facility] Duanne Limerick, MD   1 year ago Annual physical exam   [facility] Du[facility] Limerick, MD   2 years ago Night sweats   [facility] Duanne Limerick, MD      Future Appointments            In 1 month Duanne Limerick, MD [facility], [facility]

## 2021-12-15 DIAGNOSIS — H401221 Low-tension glaucoma, left eye, mild stage: Secondary | ICD-10-CM | POA: Diagnosis not present

## 2021-12-15 DIAGNOSIS — H2513 Age-related nuclear cataract, bilateral: Secondary | ICD-10-CM | POA: Diagnosis not present

## 2021-12-15 DIAGNOSIS — H5203 Hypermetropia, bilateral: Secondary | ICD-10-CM | POA: Diagnosis not present

## 2021-12-15 DIAGNOSIS — H401212 Low-tension glaucoma, right eye, moderate stage: Secondary | ICD-10-CM | POA: Diagnosis not present

## 2021-12-22 DIAGNOSIS — N5201 Erectile dysfunction due to arterial insufficiency: Secondary | ICD-10-CM | POA: Diagnosis not present

## 2021-12-22 DIAGNOSIS — Z79899 Other long term (current) drug therapy: Secondary | ICD-10-CM | POA: Diagnosis not present

## 2021-12-22 DIAGNOSIS — N401 Enlarged prostate with lower urinary tract symptoms: Secondary | ICD-10-CM | POA: Diagnosis not present

## 2022-01-13 ENCOUNTER — Ambulatory Visit (INDEPENDENT_AMBULATORY_CARE_PROVIDER_SITE_OTHER): Payer: Medicare Other | Admitting: Family Medicine

## 2022-01-13 ENCOUNTER — Encounter: Payer: Self-pay | Admitting: Family Medicine

## 2022-01-13 ENCOUNTER — Other Ambulatory Visit: Payer: Self-pay

## 2022-01-13 VITALS — BP 120/80 | HR 62 | Ht 75.0 in | Wt 186.0 lb

## 2022-01-13 DIAGNOSIS — E7801 Familial hypercholesterolemia: Secondary | ICD-10-CM

## 2022-01-13 DIAGNOSIS — R69 Illness, unspecified: Secondary | ICD-10-CM | POA: Diagnosis not present

## 2022-01-13 MED ORDER — EZETIMIBE 10 MG PO TABS: ORAL_TABLET | ORAL | 1 refills | Status: DC

## 2022-01-13 NOTE — Progress Notes (Signed)
? ? ?Date:  01/13/2022  ? ?Name:  Patrick Mcknight   DOB:  26-Jan-1951   MRN:  [id number] ? ? ?Chief Complaint: Hyperlipidemia ? ?Hyperlipidemia ?This is a chronic problem. The current episode started more than 1 year ago. The problem is controlled. Recent lipid tests were reviewed and are normal. He has no history of chronic renal disease, diabetes, hypothyroidism, liver disease, obesity or nephrotic syndrome. There are no known factors aggravating his hyperlipidemia. Pertinent negatives include no chest pain, focal sensory loss, focal weakness, leg pain, myalgias or shortness of breath. Current antihyperlipidemic treatment includes ezetimibe. The current treatment provides moderate improvement of lipids. There are no compliance problems.  There are no known risk factors for coronary artery disease.  ? ?Lab Results  ?Component Value Date  ? NA 141 03/13/2021  ? K 4.4 03/13/2021  ? CO2 22 03/13/2021  ? GLUCOSE 86 03/13/2021  ? BUN 16 03/13/2021  ? CREATININE 1.14 03/13/2021  ? CALCIUM 9.3 03/13/2021  ? EGFR 69 03/13/2021  ? GFRNONAA 66 04/23/2020  ? ?Lab Results  ?Component Value Date  ? CHOL 235 (H) 07/16/2021  ? HDL 65 07/16/2021  ? LDLCALC 160 (H) 07/16/2021  ? TRIG 58 07/16/2021  ? CHOLHDL 4.3 03/15/2019  ? ?Lab Results  ?Component Value Date  ? TSH 1.010 04/14/2019  ? ?No results found for: HGBA1C ?Lab Results  ?Component Value Date  ? WBC 5.9 03/13/2021  ? HGB 14.7 03/13/2021  ? HCT 41.5 03/13/2021  ? MCV 90 03/13/2021  ? PLT 287 03/13/2021  ? ?Lab Results  ?Component Value Date  ? ALT 21 03/13/2021  ? AST 29 03/13/2021  ? ALKPHOS 53 03/13/2021  ? BILITOT 0.8 03/13/2021  ? ?No results found for: 25OHVITD2, [place], VD25OH  ? ?Review of Systems  ?Constitutional:  Negative for chills and fever.  ?HENT:  Negative for drooling, ear discharge, ear pain and sore throat.   ?Respiratory:  Negative for cough, shortness of breath and wheezing.   ?Cardiovascular:  Negative for chest pain, palpitations and leg swelling.   ?Gastrointestinal:  Negative for abdominal pain, blood in stool, constipation, diarrhea and nausea.  ?Endocrine: Negative for polydipsia.  ?Genitourinary:  Negative for dysuria, frequency, hematuria and urgency.  ?Musculoskeletal:  Negative for back pain, myalgias and neck pain.  ?Skin:  Negative for rash.  ?Allergic/Immunologic: Negative for environmental allergies.  ?Neurological:  Negative for dizziness, focal weakness and headaches.  ?Hematological:  Does not bruise/bleed easily.  ?Psychiatric/Behavioral:  Negative for suicidal ideas. The patient is not nervous/anxious.   ? ?Patient Active Problem List  ? Diagnosis Date Noted  ? Aortic atherosclerosis ([place]) 12/15/2018  ? Glaucoma 03/09/2017  ? Diverticulosis 02/14/2015  ? Irritable bowel syndrome (IBS) 02/14/2015  ? Hx of iron deficiency anemia 02/14/2015  ? Hyperlipemia intolerant statins 02/14/2015  ? Vitamin D deficiency 02/14/2015  ? Erectile dysfunction 02/14/2015  ? Ureteral stricture s/p stent 02/14/2015  ? ? ?Allergies  ?Allergen Reactions  ? Crestor [Rosuvastatin]   ?  kidney pain  ? ? ?Past Surgical History:  ?Procedure Laterality Date  ? URETHRA SURGERY  1990's  ? stent placement  ? ? ?Social History  ? ?Tobacco Use  ? Smoking status: Never  ? Smokeless tobacco: Never  ?Vaping Use  ? Vaping Use: Never used  ?Substance Use Topics  ? Alcohol use: Not Currently  ?  Alcohol/week: 0.0 standard drinks  ? Drug use: No  ? ? ? ?Medication list has been reviewed and updated. ? ?Current Meds  ?  Medication Sig  ? aspirin EC 81 MG tablet Take 81 mg by mouth daily.  ? brimonidine (ALPHAGAN) 0.2 % ophthalmic solution SMARTSIG:In Eye(s)  ? Cholecalciferol (VITAMIN D3) 5000 units CAPS Take 1 capsule by mouth daily.  ? ezetimibe (ZETIA) 10 MG tablet TAKE 1 TABLET(10 MG) BY MOUTH DAILY  ? latanoprost (XALATAN) 0.005 % ophthalmic solution Place 1 drop into both eyes at bedtime.  ? Multiple Vitamin (MULTIVITAMIN) tablet Take 1 tablet by mouth daily.  ? Omega-3 Fatty Acids  (FISH OIL CONCENTRATE PO) Take by mouth.  ? timolol (TIMOPTIC) 0.25 % ophthalmic solution Place 1 drop into both eyes every morning.  ? vitamin E 180 MG (400 UNITS) capsule Take 400 Units by mouth daily.  ? ? ?PHQ 2/9 Scores 01/13/2022 07/16/2021 03/13/2021 03/12/2021  ?PHQ - 2 Score 0 0 0 0  ?PHQ- 9 Score 0 0 0 -  ? ? ?GAD 7 : Generalized Anxiety Score 01/13/2022 07/16/2021 03/13/2021 04/22/2020  ?Nervous, Anxious, on Edge 0 0 0 0  ?Control/stop worrying 0 0 0 0  ?Worry too much - different things 0 0 0 0  ?Trouble relaxing 0 0 0 0  ?Restless 0 0 0 0  ?Easily annoyed or irritable 0 0 0 0  ?Afraid - awful might happen 0 0 0 0  ?Total GAD 7 Score 0 0 0 0  ?Anxiety Difficulty Not difficult at all - Not difficult at all -  ? ? ?BP Readings from Last 3 Encounters:  ?01/13/22 120/80  ?07/16/21 120/62  ?03/13/21 118/78  ? ? ?Physical Exam ?Vitals and nursing note reviewed.  ?HENT:  ?   Head: Normocephalic.  ?   Right Ear: Tympanic membrane and external ear normal.  ?   Left Ear: Tympanic membrane and external ear normal.  ?   Nose: Nose normal.  ?Eyes:  ?   General: No scleral icterus.    ?   Right eye: No discharge.     ?   Left eye: No discharge.  ?   Conjunctiva/sclera: Conjunctivae normal.  ?   Pupils: Pupils are equal, round, and reactive to light.  ?Neck:  ?   Thyroid: No thyromegaly.  ?   Vascular: No JVD.  ?   Trachea: No tracheal deviation.  ?Cardiovascular:  ?   Rate and Rhythm: Normal rate and regular rhythm.  ?   Chest Wall: PMI is not displaced. No thrill.  ?   Pulses: Normal pulses.  ?   Heart sounds: Normal heart sounds, S1 normal and S2 normal. No murmur heard. ?No systolic murmur is present.  ?No diastolic murmur is present.  ?  No friction rub. No gallop. No S3 or S4 sounds.  ?   Comments: Percussion normal size ?Pulmonary:  ?   Effort: No respiratory distress.  ?   Breath sounds: Normal breath sounds. No wheezing, rhonchi or rales.  ?Abdominal:  ?   General: Bowel sounds are normal.  ?   Palpations: Abdomen is  soft. There is no mass.  ?   Tenderness: There is no abdominal tenderness. There is no guarding or rebound.  ?Musculoskeletal:     ?   General: No tenderness. Normal range of motion.  ?   Cervical back: Normal range of motion and neck supple.  ?   Right lower leg: No edema.  ?   Left lower leg: No edema.  ?Lymphadenopathy:  ?   Cervical: No cervical adenopathy.  ?Skin: ?   General: Skin is warm.  ?  Findings: No rash.  ?Neurological:  ?   Mental Status: He is alert and oriented to person, place, and time.  ?   Cranial Nerves: No cranial nerve deficit.  ?   Deep Tendon Reflexes: Reflexes are normal and symmetric.  ? ? ?Wt Readings from Last 3 Encounters:  ?01/13/22 186 lb (84.4 kg)  ?07/16/21 180 lb (81.6 kg)  ?03/13/21 183 lb (83 kg)  ? ? ?BP 120/80   Pulse 62   Ht $R'6\' 3"'kv$  (1.905 m)   Wt 186 lb (84.4 kg)   BMI 23.25 kg/m?  ? ?Assessment and Plan: ? ?1. Familial hypercholesterolemia ?Chronic.  Controlled.  Stable.  Continue Zetia 10 mg once a day.  Will check lipid panel for current level of control ?- ezetimibe (ZETIA) 10 MG tablet; TAKE 1 TABLET(10 MG) BY MOUTH DAILY  Dispense: 90 tablet; Refill: 1 ?- Lipid Panel With LDL/HDL Ratio ? ?2. Taking medication for chronic disease ?We will check CMP for evaluation of electrolytes GFR and hepatic concerns. ?- Comprehensive Metabolic Panel (CMET)  ? ? ?

## 2022-01-14 LAB — LIPID PANEL WITH LDL/HDL RATIO
Cholesterol, Total: 230 mg/dL — ABNORMAL HIGH (ref 100–199)
HDL: 62 mg/dL (ref >39)
LDL Chol Calc (NIH): 157 mg/dL — ABNORMAL HIGH (ref 0–99)
LDL/HDL Ratio: 2.5 ratio (ref 0.0–3.6)
Triglycerides: 67 mg/dL (ref 0–149)
VLDL Cholesterol Cal: 11 mg/dL (ref 5–40)

## 2022-01-14 LAB — COMPREHENSIVE METABOLIC PANEL
ALT: 21 IU/L (ref 0–44)
AST: 29 IU/L (ref 0–40)
Albumin/Globulin Ratio: 1.7 (ref 1.2–2.2)
Albumin: 4.3 g/dL (ref 3.7–4.7)
Alkaline Phosphatase: 51 IU/L (ref 44–121)
BUN/Creatinine Ratio: 15 (ref 10–24)
BUN: 19 mg/dL (ref 8–27)
Bilirubin Total: 0.6 mg/dL (ref 0.0–1.2)
CO2: 27 mmol/L (ref 20–29)
Calcium: 9.2 mg/dL (ref 8.6–10.2)
Chloride: 104 mmol/L (ref 96–106)
Creatinine, Ser: 1.24 mg/dL (ref 0.76–1.27)
Globulin, Total: 2.6 g/dL (ref 1.5–4.5)
Glucose: 84 mg/dL (ref 70–99)
Potassium: 4.7 mmol/L (ref 3.5–5.2)
Sodium: 142 mmol/L (ref 134–144)
Total Protein: 6.9 g/dL (ref 6.0–8.5)
eGFR: 62 mL/min/{1.73_m2} (ref >59)

## 2022-03-16 ENCOUNTER — Ambulatory Visit (INDEPENDENT_AMBULATORY_CARE_PROVIDER_SITE_OTHER): Payer: Medicare Other

## 2022-03-16 DIAGNOSIS — Z0000 Encounter for general adult medical examination without abnormal findings: Secondary | ICD-10-CM

## 2022-03-16 NOTE — Patient Instructions (Signed)
Patrick Mcknight , Thank you for taking time to come for your Medicare Wellness Visit. I appreciate your ongoing commitment to your health goals. Please review the following plan we discussed and let me know if I can assist you in the future.   Screening recommendations/referrals: Colonoscopy: done 06/06/20. Repeat 05/2030 Recommended yearly ophthalmology/optometry visit for glaucoma screening and checkup Recommended yearly dental visit for hygiene and checkup  Vaccinations: Influenza vaccine: due fall 2023 Pneumococcal vaccine: done 03/09/17 Tdap vaccine: done 2014 Shingles vaccine: 1st dose 01/14/22; due for second dose   Covid-19: done 12/08/19, 12/29/19, 08/22/20 & 02/10/21  Advanced directives: Advance directive discussed with you today. I have provided a copy for you to complete at home and have notarized. Once this is complete please bring a copy in to our office so we can scan it into your chart.   Conditions/risks identified: Keep up the great work!  Next appointment: Follow up in one year for your annual wellness visit.   Preventive Care 47 Years and Older, Male Preventive care refers to lifestyle choices and visits with your health care provider that can promote health and wellness. What does preventive care include? A yearly physical exam. This is also called an annual well check. Dental exams once or twice a year. Routine eye exams. Ask your health care provider how often you should have your eyes checked. Personal lifestyle choices, including: Daily care of your teeth and gums. Regular physical activity. Eating a healthy diet. Avoiding tobacco and drug use. Limiting alcohol use. Practicing safe sex. Taking low doses of aspirin every day. Taking vitamin and mineral supplements as recommended by your health care provider. What happens during an annual well check? The services and screenings done by your health care provider during your annual well check will depend on your age,  overall health, lifestyle risk factors, and family history of disease. Counseling  Your health care provider may ask you questions about your: Alcohol use. Tobacco use. Drug use. Emotional well-being. Home and relationship well-being. Sexual activity. Eating habits. History of falls. Memory and ability to understand (cognition). Work and work Astronomer. Screening  You may have the following tests or measurements: Height, weight, and BMI. Blood pressure. Lipid and cholesterol levels. These may be checked every 5 years, or more frequently if you are over 43 years old. Skin check. Lung cancer screening. You may have this screening every year starting at age 53 if you have a 30-pack-year history of smoking and currently smoke or have quit within the past 15 years. Fecal occult blood test (FOBT) of the stool. You may have this test every year starting at age 48. Flexible sigmoidoscopy or colonoscopy. You may have a sigmoidoscopy every 5 years or a colonoscopy every 10 years starting at age 57. Prostate cancer screening. Recommendations will vary depending on your family history and other risks. Hepatitis C blood test. Hepatitis B blood test. Sexually transmitted disease (STD) testing. Diabetes screening. This is done by checking your blood sugar (glucose) after you have not eaten for a while (fasting). You may have this done every 1-3 years. Abdominal aortic aneurysm (AAA) screening. You may need this if you are a current or former smoker. Osteoporosis. You may be screened starting at age 12 if you are at high risk. Talk with your health care provider about your test results, treatment options, and if necessary, the need for more tests. Vaccines  Your health care provider may recommend certain vaccines, such as: Influenza vaccine. This is recommended every  year. Tetanus, diphtheria, and acellular pertussis (Tdap, Td) vaccine. You may need a Td booster every 10 years. Zoster vaccine.  You may need this after age 29. Pneumococcal 13-valent conjugate (PCV13) vaccine. One dose is recommended after age 39. Pneumococcal polysaccharide (PPSV23) vaccine. One dose is recommended after age 22. Talk to your health care provider about which screenings and vaccines you need and how often you need them. This information is not intended to replace advice given to you by your health care provider. Make sure you discuss any questions you have with your health care provider. Document Released: 11/08/2015 Document Revised: 07/01/2016 Document Reviewed: 08/13/2015 Elsevier Interactive Patient Education  2017 [place] Prevention in the Home Falls can cause injuries. They can happen to people of all ages. There are many things you can do to make your home safe and to help prevent falls. What can I do on the outside of my home? Regularly fix the edges of walkways and driveways and fix any cracks. Remove anything that might make you trip as you walk through a door, such as a raised step or threshold. Trim any bushes or trees on the path to your home. Use bright outdoor lighting. Clear any walking paths of anything that might make someone trip, such as rocks or tools. Regularly check to see if handrails are loose or broken. Make sure that both sides of any steps have handrails. Any raised decks and porches should have guardrails on the edges. Have any leaves, snow, or ice cleared regularly. Use sand or salt on walking paths during winter. Clean up any spills in your garage right away. This includes oil or grease spills. What can I do in the bathroom? Use night lights. Install grab bars by the toilet and in the tub and shower. Do not use towel bars as grab bars. Use non-skid mats or decals in the tub or shower. If you need to sit down in the shower, use a plastic, non-slip stool. Keep the floor dry. Clean up any water that spills on the floor as soon as it happens. Remove soap  buildup in the tub or shower regularly. Attach bath mats securely with double-sided non-slip rug tape. Do not have throw rugs and other things on the floor that can make you trip. What can I do in the bedroom? Use night lights. Make sure that you have a light by your bed that is easy to reach. Do not use any sheets or blankets that are too big for your bed. They should not hang down onto the floor. Have a firm chair that has side arms. You can use this for support while you get dressed. Do not have throw rugs and other things on the floor that can make you trip. What can I do in the kitchen? Clean up any spills right away. Avoid walking on wet floors. Keep items that you use a lot in easy-to-reach places. If you need to reach something above you, use a strong step stool that has a grab bar. Keep electrical cords out of the way. Do not use floor polish or wax that makes floors slippery. If you must use wax, use non-skid floor wax. Do not have throw rugs and other things on the floor that can make you trip. What can I do with my stairs? Do not leave any items on the stairs. Make sure that there are handrails on both sides of the stairs and use them. Fix handrails that are broken or  loose. Make sure that handrails are as long as the stairways. Check any carpeting to make sure that it is firmly attached to the stairs. Fix any carpet that is loose or worn. Avoid having throw rugs at the top or bottom of the stairs. If you do have throw rugs, attach them to the floor with carpet tape. Make sure that you have a light switch at the top of the stairs and the bottom of the stairs. If you do not have them, ask someone to add them for you. What else can I do to help prevent falls? Wear shoes that: Do not have high heels. Have rubber bottoms. Are comfortable and fit you well. Are closed at the toe. Do not wear sandals. If you use a stepladder: Make sure that it is fully opened. Do not climb a closed  stepladder. Make sure that both sides of the stepladder are locked into place. Ask someone to hold it for you, if possible. Clearly mark and make sure that you can see: Any grab bars or handrails. First and last steps. Where the edge of each step is. Use tools that help you move around (mobility aids) if they are needed. These include: Canes. Walkers. Scooters. Crutches. Turn on the lights when you go into a dark area. Replace any light bulbs as soon as they burn out. Set up your furniture so you have a clear path. Avoid moving your furniture around. If any of your floors are uneven, fix them. If there are any pets around you, be aware of where they are. Review your medicines with your doctor. Some medicines can make you feel dizzy. This can increase your chance of falling. Ask your doctor what other things that you can do to help prevent falls. This information is not intended to replace advice given to you by your health care provider. Make sure you discuss any questions you have with your health care provider. Document Released: 08/08/2009 Document Revised: 03/19/2016 Document Reviewed: 11/16/2014 Elsevier Interactive Patient Education  2017 [employer].

## 2022-03-16 NOTE — Progress Notes (Signed)
Subjective:   Patrick Mcknight is a 71 y.o. male who presents for Medicare Annual/Subsequent preventive examination.  Virtual Visit via Telephone Note  I connected with  [name] on 03/16/22 at  8:45 AM EDT by telephone and verified that I am speaking with the correct person using two identifiers.  Location: Patient: home Provider: [facility] Persons participating in the virtual visit: patient/Nurse Health Advisor   I discussed the limitations, risks, security and privacy concerns of performing an evaluation and management service by telephone and the availability of in person appointments. The patient expressed understanding and agreed to proceed.  Interactive audio and video telecommunications were attempted between this nurse and patient, however failed, due to patient having technical difficulties OR patient did not have access to video capability.  We continued and completed visit with audio only.  Some vital signs may be absent or patient reported.   Reather Littler, LPN   Review of Systems     Cardiac Risk Factors include: advanced age (>81men, >84 women);male gender;dyslipidemia;hypertension     Objective:    There were no vitals filed for this visit. There is no height or weight on file to calculate BMI.     03/16/2022    8:53 AM 03/12/2021    8:58 AM 03/11/2020    8:36 AM  Advanced Directives  Does Patient Have a Medical Advance Directive? No No No  Does patient want to make changes to medical advance directive?  No - Patient declined   Would patient like information on creating a medical advance directive? Yes (MAU/Ambulatory/Procedural Areas - Information given)  Yes (MAU/Ambulatory/Procedural Areas - Information given)    Current Medications (verified) Outpatient Encounter Medications as of 03/16/2022  Medication Sig   aspirin EC 81 MG tablet Take 81 mg by mouth daily.   brimonidine (ALPHAGAN) 0.2 % ophthalmic solution SMARTSIG:In Eye(s)   Cholecalciferol  (VITAMIN D3) 5000 units CAPS Take 1 capsule by mouth daily.   ezetimibe (ZETIA) 10 MG tablet TAKE 1 TABLET(10 MG) BY MOUTH DAILY   latanoprost (XALATAN) 0.005 % ophthalmic solution Place 1 drop into both eyes at bedtime.   Multiple Vitamin (MULTIVITAMIN) tablet Take 1 tablet by mouth daily.   Omega-3 Fatty Acids (FISH OIL CONCENTRATE PO) Take by mouth.   timolol (TIMOPTIC) 0.25 % ophthalmic solution Place 1 drop into both eyes every morning.   vitamin E 180 MG (400 UNITS) capsule Take 400 Units by mouth daily.   No facility-administered encounter medications on file as of 03/16/2022.    Allergies (verified) Crestor [rosuvastatin]   History: Past Medical History:  Diagnosis Date   Erectile dysfunction    GERD (gastroesophageal reflux disease)    Past Surgical History:  Procedure Laterality Date   URETHRA SURGERY  1990's   stent placement   Family History  Family history unknown: Yes   Social History   Socioeconomic History   Marital status: Widowed    Spouse name: Not on file   Number of children: 2   Years of education: Not on file   Highest education level: Not on file  Occupational History   Not on file  Tobacco Use   Smoking status: Never   Smokeless tobacco: Never  Vaping Use   Vaping Use: Never used  Substance and Sexual Activity   Alcohol use: Not Currently    Alcohol/week: 0.0 standard drinks   Drug use: No   Sexual activity: Yes  Other Topics Concern   Not on file  Social History  Narrative   2 adopted children.    Social Determinants of Health   Financial Resource Strain: Low Risk    Difficulty of Paying Living Expenses: Not hard at all  Food Insecurity: No Food Insecurity   Worried About Programme researcher, broadcasting/film/video in the Last Year: Never true   Ran Out of Food in the Last Year: Never true  Transportation Needs: No Transportation Needs   Lack of Transportation (Medical): No   Lack of Transportation (Non-Medical): No  Physical Activity: Sufficiently  Active   Days of Exercise per Week: 6 days   Minutes of Exercise per Session: 60 min  Stress: No Stress Concern Present   Feeling of Stress : Not at all  Social Connections: Moderately Isolated   Frequency of Communication with Friends and Family: More than three times a week   Frequency of Social Gatherings with Friends and Family: More than three times a week   Attends Religious Services: More than 4 times per year   Active Member of [employer] or Organizations: No   Attends Banker Meetings: Never   Marital Status: Widowed    Tobacco Counseling Counseling given: Not Answered   Clinical Intake:  Pre-visit preparation completed: Yes  Pain : No/denies pain     Nutritional Risks: None Diabetes: No  How often do you need to have someone help you when you read instructions, pamphlets, or other written materials from your doctor or pharmacy?: 1 - Never   Interpreter Needed?: No  Information entered by :: Reather Littler LPN   Activities of Daily Living    03/16/2022    8:55 AM  In your present state of health, do you have any difficulty performing the following activities:  Hearing? 1  Vision? 0  Difficulty concentrating or making decisions? 0  Walking or climbing stairs? 0  Dressing or bathing? 0  Doing errands, shopping? 0  Preparing Food and eating ? N  Using the Toilet? N  In the past six months, have you accidently leaked urine? N  Do you have problems with loss of bowel control? N  Managing your Medications? N  Managing your Finances? N  Housekeeping or managing your Housekeeping? N    Patient Care Team: Duanne Limerick, MD as PCP - General (Family Medicine) Orson Ape, MD as Consulting Physician (Urology)  Indicate any recent Medical Services you may have received from other than [facility] providers in the past year (date may be approximate).     Assessment:   This is a routine wellness examination for Alter.  Hearing/Vision screen Hearing  Screening - Comments:: Pt states mild hearing difficulty; hearing evaluation [facility] 01/2022  Vision Screening - Comments:: Annual vision screenings done by Dr. Larence Penning  Dietary issues and exercise activities discussed: Current Exercise Habits: Home exercise routine, Type of exercise: strength training/weights;calisthenics;treadmill, Time (Minutes): 60, Frequency (Times/Week): 6, Weekly Exercise (Minutes/Week): 360, Intensity: Moderate, Exercise limited by: None identified   Goals Addressed             This Visit's Progress    Patient Stated   On track    Patient states he would like to remain healthy and active.        Depression Screen    03/16/2022    8:52 AM 01/13/2022    9:01 AM 07/16/2021    9:24 AM 03/13/2021    8:40 AM 03/12/2021    8:58 AM 04/22/2020    8:12 AM 03/11/2020    8:33  AM  PHQ 2/9 Scores  PHQ - 2 Score 0 0 0 0 0 0 0  PHQ- 9 Score  0 0 0       Fall Risk    03/16/2022    8:55 AM 07/16/2021    9:23 AM 03/12/2021    9:00 AM 04/22/2020    8:11 AM 03/11/2020    8:37 AM  Fall Risk   Falls in the past year? 0 0 0 0 0  Number falls in past yr: 0 0 0  0  Injury with Fall? 0 0 0  0  Risk for fall due to : No Fall Risks No Fall Risks No Fall Risks  No Fall Risks  Follow up Falls prevention discussed Falls evaluation completed Falls prevention discussed Falls evaluation completed Falls prevention discussed    FALL RISK PREVENTION PERTAINING TO THE HOME:  Any stairs in or around the home? Yes  If so, are there any without handrails? No  Home free of loose throw rugs in walkways, pet beds, electrical cords, etc? Yes  Adequate lighting in your home to reduce risk of falls? Yes   ASSISTIVE DEVICES UTILIZED TO PREVENT FALLS:  Life alert? No  Use of a cane, walker or w/c? No  Grab bars in the bathroom? Yes  Shower chair or bench in shower? Yes  Elevated toilet seat or a handicapped toilet? Yes   TIMED UP AND GO:  Was the test performed? No . Telephonic  visit.   Cognitive Function: Normal cognitive status assessed by direct observation by this Nurse Health Advisor. No abnormalities found.          Immunizations Immunization History  Administered Date(s) Administered   Fluad Quad(high Dose 65+) 08/01/2019   Influenza, High Dose Seasonal PF 08/22/2020   PFIZER(Purple Top)SARS-COV-2 Vaccination 12/08/2019, 12/29/2019, 08/22/2020, 02/10/2021   Pneumococcal Conjugate-13 03/10/2016   Pneumococcal Polysaccharide-23 03/09/2017   Zoster Recombinat (Shingrix) 01/14/2022    TDAP status: Up to date  Flu Vaccine status: Due, Education has been provided regarding the importance of this vaccine. Advised may receive this vaccine at local pharmacy or Health Dept. Aware to provide a copy of the vaccination record if obtained from local pharmacy or Health Dept. Verbalized acceptance and understanding.  Pneumococcal vaccine status: Up to date  Covid-19 vaccine status: Completed vaccines  Qualifies for Shingles Vaccine? Yes   Zostavax completed No   Shingrix Completed?: Yes  Screening Tests Health Maintenance  Topic Date Due   COVID-19 Vaccine (5 - Booster for Pfizer series) 04/07/2021   Zoster Vaccines- Shingrix (2 of 2) 03/11/2022   INFLUENZA VACCINE  05/26/2022   TETANUS/TDAP  10/26/2022   COLONOSCOPY (Pts 45-255yrs Insurance coverage will need to be confirmed)  06/06/2030   Pneumonia Vaccine 3665+ Years old  Completed   Hepatitis C Screening  Completed   HPV VACCINES  Aged Out    Health Maintenance  Health Maintenance Due  Topic Date Due   COVID-19 Vaccine (5 - Booster for Pfizer series) 04/07/2021   Zoster Vaccines- Shingrix (2 of 2) 03/11/2022    Colorectal cancer screening: Type of screening: Colonoscopy. Completed 06/06/20. Repeat every 10 years  Lung Cancer Screening: (Low Dose CT Chest recommended if Age 5-80 years, 30 pack-year currently smoking OR have quit w/in 15years.) does not qualify.  Additional  Screening:  Hepatitis C Screening: does qualify; Completed 03/07/18  Vision Screening: Recommended annual ophthalmology exams for early detection of glaucoma and other disorders of the eye. Is the patient up to  date with their annual eye exam?  Yes  Who is the provider or what is the name of the office in which the patient attends annual eye exams? Dr. Larence Penning.   Dental Screening: Recommended annual dental exams for proper oral hygiene  Community Resource Referral / Chronic Care Management: CRR required this visit?  No   CCM required this visit?  No      Plan:     I have personally reviewed and noted the following in the patient's chart:   Medical and social history Use of alcohol, tobacco or illicit drugs  Current medications and supplements including opioid prescriptions. Patient is not currently taking opioid prescriptions. Functional ability and status Nutritional status Physical activity Advanced directives List of other physicians Hospitalizations, surgeries, and ER visits in previous 12 months Vitals Screenings to include cognitive, depression, and falls Referrals and appointments  In addition, I have reviewed and discussed with patient certain preventive protocols, quality metrics, and best practice recommendations. A written personalized care plan for preventive services as well as general preventive health recommendations were provided to patient.     Reather Littler, LPN   [id number]/62/[id number]   Nurse Notes: none

## 2022-03-19 DIAGNOSIS — H5203 Hypermetropia, bilateral: Secondary | ICD-10-CM | POA: Diagnosis not present

## 2022-03-19 DIAGNOSIS — H401212 Low-tension glaucoma, right eye, moderate stage: Secondary | ICD-10-CM | POA: Diagnosis not present

## 2022-03-19 DIAGNOSIS — H2513 Age-related nuclear cataract, bilateral: Secondary | ICD-10-CM | POA: Diagnosis not present

## 2022-03-19 DIAGNOSIS — H401221 Low-tension glaucoma, left eye, mild stage: Secondary | ICD-10-CM | POA: Diagnosis not present

## 2022-06-18 DIAGNOSIS — H401212 Low-tension glaucoma, right eye, moderate stage: Secondary | ICD-10-CM | POA: Diagnosis not present

## 2022-06-18 DIAGNOSIS — H5203 Hypermetropia, bilateral: Secondary | ICD-10-CM | POA: Diagnosis not present

## 2022-06-18 DIAGNOSIS — H2513 Age-related nuclear cataract, bilateral: Secondary | ICD-10-CM | POA: Diagnosis not present

## 2022-06-18 DIAGNOSIS — H401221 Low-tension glaucoma, left eye, mild stage: Secondary | ICD-10-CM | POA: Diagnosis not present

## 2022-07-16 ENCOUNTER — Encounter: Payer: Self-pay | Admitting: Family Medicine

## 2022-07-16 ENCOUNTER — Ambulatory Visit (INDEPENDENT_AMBULATORY_CARE_PROVIDER_SITE_OTHER): Payer: Medicare Other | Admitting: Family Medicine

## 2022-07-16 VITALS — BP 110/80 | HR 80 | Ht 75.0 in | Wt 186.0 lb

## 2022-07-16 DIAGNOSIS — Z23 Encounter for immunization: Secondary | ICD-10-CM | POA: Diagnosis not present

## 2022-07-16 DIAGNOSIS — E7801 Familial hypercholesterolemia: Secondary | ICD-10-CM

## 2022-07-16 MED ORDER — EZETIMIBE 10 MG PO TABS: ORAL_TABLET | ORAL | 1 refills | Status: DC

## 2022-07-16 NOTE — Progress Notes (Signed)
Date:  07/16/2022   Name:  Patrick Mcknight   DOB:  09-09-1951   MRN:  [id number]   Chief Complaint: Hyperlipidemia  Hyperlipidemia This is a chronic problem. The current episode started more than 1 year ago. The problem is controlled. Recent lipid tests were reviewed and are normal. He has no history of chronic renal disease, diabetes, hypothyroidism, liver disease, obesity or nephrotic syndrome. There are no known factors aggravating his hyperlipidemia. Pertinent negatives include no chest pain, focal sensory loss, focal weakness, leg pain, myalgias or shortness of breath. Current antihyperlipidemic treatment includes statins. The current treatment provides moderate improvement of lipids. There are no compliance problems.  There are no known risk factors for coronary artery disease.    Lab Results  Component Value Date   NA 142 01/13/2022   K 4.7 01/13/2022   CO2 27 01/13/2022   GLUCOSE 84 01/13/2022   BUN 19 01/13/2022   CREATININE 1.24 01/13/2022   CALCIUM 9.2 01/13/2022   EGFR 62 01/13/2022   GFRNONAA 66 04/23/2020   Lab Results  Component Value Date   CHOL 230 (H) 01/13/2022   HDL 62 01/13/2022   LDLCALC 157 (H) 01/13/2022   TRIG 67 01/13/2022   CHOLHDL 4.3 03/15/2019   Lab Results  Component Value Date   TSH 1.010 04/14/2019   No results found for: "HGBA1C" Lab Results  Component Value Date   WBC 5.9 03/13/2021   HGB 14.7 03/13/2021   HCT 41.5 03/13/2021   MCV 90 03/13/2021   PLT 287 03/13/2021   Lab Results  Component Value Date   ALT 21 01/13/2022   AST 29 01/13/2022   ALKPHOS 51 01/13/2022   BILITOT 0.6 01/13/2022   No results found for: "25OHVITD2", "25OHVITD3", "VD25OH"   Review of Systems  Constitutional:  Negative for chills and fever.  HENT:  Negative for ear pain, sore throat and trouble swallowing.   Respiratory:  Negative for cough, shortness of breath and wheezing.   Cardiovascular:  Negative for chest pain, palpitations and leg swelling.   Gastrointestinal:  Negative for abdominal pain, blood in stool, constipation, diarrhea and nausea.  Endocrine: Negative for polydipsia.  Genitourinary:  Negative for dysuria, frequency, hematuria and urgency.  Musculoskeletal:  Negative for arthralgias, back pain, myalgias and neck pain.  Skin:  Negative for rash.  Allergic/Immunologic: Negative for environmental allergies.  Neurological:  Negative for dizziness, focal weakness and headaches.  Hematological:  Does not bruise/bleed easily.  Psychiatric/Behavioral:  Negative for suicidal ideas. The patient is not nervous/anxious.     Patient Active Problem List   Diagnosis Date Noted   Primary open angle glaucoma of left eye, mild stage 05/26/2021   Primary open angle glaucoma of right eye, moderate stage 05/26/2021   Aortic atherosclerosis (HCC) 12/15/2018   Glaucoma 03/09/2017   Diverticulosis 02/14/2015   Irritable bowel syndrome (IBS) 02/14/2015   Hx of iron deficiency anemia 02/14/2015   Hyperlipemia intolerant statins 02/14/2015   Vitamin D deficiency 02/14/2015   Erectile dysfunction 02/14/2015   Ureteral stricture s/p stent 02/14/2015    Allergies  Allergen Reactions   Crestor [Rosuvastatin]     kidney pain    Past Surgical History:  Procedure Laterality Date   URETHRA SURGERY  1990's   stent placement    Social History   Tobacco Use   Smoking status: Never   Smokeless tobacco: Never  Vaping Use   Vaping Use: Never used  Substance Use Topics   Alcohol use: Not Currently  Alcohol/week: 0.0 standard drinks of alcohol   Drug use: No     Medication list has been reviewed and updated.  Current Meds  Medication Sig   aspirin EC 81 MG tablet Take 81 mg by mouth daily.   brimonidine (ALPHAGAN) 0.2 % ophthalmic solution SMARTSIG:In Eye(s)   Cholecalciferol (VITAMIN D3) 5000 units CAPS Take 1 capsule by mouth daily.   ezetimibe (ZETIA) 10 MG tablet TAKE 1 TABLET(10 MG) BY MOUTH DAILY   latanoprost (XALATAN)  0.005 % ophthalmic solution Place 1 drop into both eyes at bedtime.   Multiple Vitamin (MULTIVITAMIN) tablet Take 1 tablet by mouth daily.   Omega-3 Fatty Acids (FISH OIL CONCENTRATE PO) Take by mouth.   timolol (TIMOPTIC) 0.25 % ophthalmic solution Place 1 drop into both eyes every morning.   vitamin E 180 MG (400 UNITS) capsule Take 400 Units by mouth daily.       07/16/2022    8:58 AM 01/13/2022    9:01 AM 07/16/2021    9:24 AM 03/13/2021    8:40 AM  GAD 7 : Generalized Anxiety Score  Nervous, Anxious, on Edge 0 0 0 0  Control/stop worrying 0 0 0 0  Worry too much - different things 0 0 0 0  Trouble relaxing 0 0 0 0  Restless 0 0 0 0  Easily annoyed or irritable 0 0 0 0  Afraid - awful might happen 0 0 0 0  Total GAD 7 Score 0 0 0 0  Anxiety Difficulty Not difficult at all Not difficult at all  Not difficult at all       07/16/2022    8:57 AM 03/16/2022    8:52 AM 01/13/2022    9:01 AM  Depression screen PHQ 2/9  Decreased Interest 0 0 0  Down, Depressed, Hopeless 0 0 0  PHQ - 2 Score 0 0 0  Altered sleeping 0  0  Tired, decreased energy 0  0  Change in appetite 0  0  Feeling bad or failure about yourself  0  0  Trouble concentrating 0  0  Moving slowly or fidgety/restless 0  0  Suicidal thoughts 0  0  PHQ-9 Score 0  0  Difficult doing work/chores Not difficult at all  Not difficult at all    BP Readings from Last 3 Encounters:  07/16/22 110/80  01/13/22 120/80  07/16/21 120/62    Physical Exam Vitals and nursing note reviewed.  HENT:     Head: Normocephalic.     Right Ear: Tympanic membrane and external ear normal.     Left Ear: Tympanic membrane and external ear normal.     Mouth/Throat:     Mouth: Mucous membranes are moist.  Eyes:     General: No scleral icterus.    Pupils: Pupils are equal, round, and reactive to light.  Neck:     Thyroid: No thyromegaly.     Vascular: No JVD.     Trachea: No tracheal deviation.  Cardiovascular:     Rate and Rhythm:  Normal rate and regular rhythm.     Heart sounds: Normal heart sounds, S1 normal and S2 normal. No murmur heard.    No systolic murmur is present.     No diastolic murmur is present.     No friction rub. No gallop. No S3 or S4 sounds.  Pulmonary:     Effort: No respiratory distress.     Breath sounds: Normal breath sounds. No wheezing or rales.  Abdominal:  Palpations: Abdomen is soft. There is no hepatomegaly, splenomegaly or mass.     Tenderness: There is no abdominal tenderness. There is no guarding or rebound.  Musculoskeletal:        General: No tenderness. Normal range of motion.     Cervical back: Neck supple.     Right lower leg: No edema.     Left lower leg: No edema.  Lymphadenopathy:     Cervical: No cervical adenopathy.  Skin:    General: Skin is warm.     Findings: No rash.  Neurological:     Mental Status: He is alert.     Wt Readings from Last 3 Encounters:  07/16/22 186 lb (84.4 kg)  01/13/22 186 lb (84.4 kg)  07/16/21 180 lb (81.6 kg)    BP 110/80   Pulse 80   Ht $R'6\' 3"'qf$  (1.905 m)   Wt 186 lb (84.4 kg)   BMI 23.25 kg/m   Assessment and Plan:  1. Familial hypercholesterolemia Chronic.  Controlled.  Stable.  Review of lipid panel from previous encounter elevated LDL and total.  Continue Zetia 10 mg once a day and will recheck in 6 months.  We will recheck lipid panel for current fasting state. - ezetimibe (ZETIA) 10 MG tablet; TAKE 1 TABLET(10 MG) BY MOUTH DAILY  Dispense: 90 tablet; Refill: 1 - Lipid Panel With LDL/HDL Ratio  2. Need for immunization against influenza Discussed and administered - Flu Vaccine QUAD High Dose(Fluad)    Otilio Miu, MD

## 2022-07-17 LAB — LIPID PANEL WITH LDL/HDL RATIO
Cholesterol, Total: 222 mg/dL — ABNORMAL HIGH (ref 100–199)
HDL: 64 mg/dL (ref >39)
LDL Chol Calc (NIH): 147 mg/dL — ABNORMAL HIGH (ref 0–99)
LDL/HDL Ratio: 2.3 ratio (ref 0.0–3.6)
Triglycerides: 65 mg/dL (ref 0–149)
VLDL Cholesterol Cal: 11 mg/dL (ref 5–40)

## 2022-09-14 DIAGNOSIS — Z8669 Personal history of other diseases of the nervous system and sense organs: Secondary | ICD-10-CM | POA: Diagnosis not present

## 2022-09-14 DIAGNOSIS — H2513 Age-related nuclear cataract, bilateral: Secondary | ICD-10-CM | POA: Diagnosis not present

## 2022-09-14 DIAGNOSIS — H401121 Primary open-angle glaucoma, left eye, mild stage: Secondary | ICD-10-CM | POA: Diagnosis not present

## 2022-09-14 DIAGNOSIS — H401112 Primary open-angle glaucoma, right eye, moderate stage: Secondary | ICD-10-CM | POA: Diagnosis not present

## 2022-09-29 DIAGNOSIS — H401212 Low-tension glaucoma, right eye, moderate stage: Secondary | ICD-10-CM | POA: Diagnosis not present

## 2022-09-29 DIAGNOSIS — H401221 Low-tension glaucoma, left eye, mild stage: Secondary | ICD-10-CM | POA: Diagnosis not present

## 2022-09-29 DIAGNOSIS — H5203 Hypermetropia, bilateral: Secondary | ICD-10-CM | POA: Diagnosis not present

## 2022-09-29 DIAGNOSIS — H2513 Age-related nuclear cataract, bilateral: Secondary | ICD-10-CM | POA: Diagnosis not present

## 2022-12-28 DIAGNOSIS — H401121 Primary open-angle glaucoma, left eye, mild stage: Secondary | ICD-10-CM | POA: Diagnosis not present

## 2022-12-28 DIAGNOSIS — H401112 Primary open-angle glaucoma, right eye, moderate stage: Secondary | ICD-10-CM | POA: Diagnosis not present

## 2023-01-13 ENCOUNTER — Ambulatory Visit (INDEPENDENT_AMBULATORY_CARE_PROVIDER_SITE_OTHER): Payer: Medicare Other | Admitting: Family Medicine

## 2023-01-13 ENCOUNTER — Encounter: Payer: Self-pay | Admitting: Family Medicine

## 2023-01-13 VITALS — BP 120/76 | HR 95 | Ht 75.0 in | Wt 197.0 lb

## 2023-01-13 DIAGNOSIS — R69 Illness, unspecified: Secondary | ICD-10-CM

## 2023-01-13 DIAGNOSIS — N529 Male erectile dysfunction, unspecified: Secondary | ICD-10-CM

## 2023-01-13 DIAGNOSIS — N400 Enlarged prostate without lower urinary tract symptoms: Secondary | ICD-10-CM | POA: Diagnosis not present

## 2023-01-13 DIAGNOSIS — Z8551 Personal history of malignant neoplasm of bladder: Secondary | ICD-10-CM | POA: Diagnosis not present

## 2023-01-13 DIAGNOSIS — E7801 Familial hypercholesterolemia: Secondary | ICD-10-CM | POA: Diagnosis not present

## 2023-01-13 MED ORDER — EZETIMIBE 10 MG PO TABS: ORAL_TABLET | ORAL | 1 refills | Status: DC

## 2023-01-13 NOTE — Progress Notes (Signed)
Date:  01/13/2023   Name:  Patrick Mcknight   DOB:  January 08, 1951   MRN:  [id number]   Chief Complaint: Hyperlipidemia and ref uro (Hx bladder cancer)  Hyperlipidemia This is a chronic problem. The current episode started more than 1 year ago. The problem is controlled. Recent lipid tests were reviewed and are normal. He has no history of chronic renal disease, diabetes, hypothyroidism, liver disease, obesity or nephrotic syndrome. There are no known factors aggravating his hyperlipidemia. Pertinent negatives include no chest pain, leg pain or shortness of breath. He is currently on no antihyperlipidemic treatment. The current treatment provides moderate improvement of lipids. There are no compliance problems.     Lab Results  Component Value Date   NA 142 01/13/2022   K 4.7 01/13/2022   CO2 27 01/13/2022   GLUCOSE 84 01/13/2022   BUN 19 01/13/2022   CREATININE 1.24 01/13/2022   CALCIUM 9.2 01/13/2022   EGFR 62 01/13/2022   GFRNONAA 66 04/23/2020   Lab Results  Component Value Date   CHOL 222 (H) 07/16/2022   HDL 64 07/16/2022   LDLCALC 147 (H) 07/16/2022   TRIG 65 07/16/2022   CHOLHDL 4.3 03/15/2019   Lab Results  Component Value Date   TSH 1.010 04/14/2019   No results found for: "HGBA1C" Lab Results  Component Value Date   WBC 5.9 03/13/2021   HGB 14.7 03/13/2021   HCT 41.5 03/13/2021   MCV 90 03/13/2021   PLT 287 03/13/2021   Lab Results  Component Value Date   ALT 21 01/13/2022   AST 29 01/13/2022   ALKPHOS 51 01/13/2022   BILITOT 0.6 01/13/2022   No results found for: "25OHVITD2", "25OHVITD3", "VD25OH"   Review of Systems  Respiratory:  Negative for chest tightness, shortness of breath and wheezing.   Cardiovascular:  Negative for chest pain and palpitations.  Endocrine: Negative for polydipsia and polyuria.  Genitourinary:  Negative for difficulty urinating, flank pain, frequency, hematuria and urgency.    Patient Active Problem List   Diagnosis Date  Noted   Primary open angle glaucoma of left eye, mild stage 05/26/2021   Primary open angle glaucoma of right eye, moderate stage 05/26/2021   Aortic atherosclerosis ([place]) 12/15/2018   Glaucoma 03/09/2017   Diverticulosis 02/14/2015   Irritable bowel syndrome (IBS) 02/14/2015   Hx of iron deficiency anemia 02/14/2015   Hyperlipemia intolerant statins 02/14/2015   Vitamin D deficiency 02/14/2015   Erectile dysfunction 02/14/2015   Ureteral stricture s/p stent 02/14/2015    Allergies  Allergen Reactions   Crestor [Rosuvastatin]     kidney pain    Past Surgical History:  Procedure Laterality Date   URETHRA SURGERY  1990's   stent placement    Social History   Tobacco Use   Smoking status: Never   Smokeless tobacco: Never  Vaping Use   Vaping Use: Never used  Substance Use Topics   Alcohol use: Not Currently    Alcohol/week: 0.0 standard drinks of alcohol   Drug use: No     Medication list has been reviewed and updated.  Current Meds  Medication Sig   aspirin EC 81 MG tablet Take 81 mg by mouth daily.   brimonidine (ALPHAGAN) 0.2 % ophthalmic solution SMARTSIG:In Eye(s)   Cholecalciferol (VITAMIN D3) 5000 units CAPS Take 1 capsule by mouth daily.   ezetimibe (ZETIA) 10 MG tablet TAKE 1 TABLET(10 MG) BY MOUTH DAILY   latanoprost (XALATAN) 0.005 % ophthalmic solution Place 1 drop  into both eyes at bedtime.   Multiple Vitamin (MULTIVITAMIN) tablet Take 1 tablet by mouth daily.   Omega-3 Fatty Acids (FISH OIL CONCENTRATE PO) Take by mouth.   timolol (TIMOPTIC) 0.25 % ophthalmic solution Place 1 drop into both eyes every morning.   vitamin E 180 MG (400 UNITS) capsule Take 400 Units by mouth daily.       01/13/2023    9:55 AM 07/16/2022    8:58 AM 01/13/2022    9:01 AM 07/16/2021    9:24 AM  GAD 7 : Generalized Anxiety Score  Nervous, Anxious, on Edge 0 0 0 0  Control/stop worrying 0 0 0 0  Worry too much - different things 0 0 0 0  Trouble relaxing 0 0 0 0   Restless 0 0 0 0  Easily annoyed or irritable 0 0 0 0  Afraid - awful might happen 0 0 0 0  Total GAD 7 Score 0 0 0 0  Anxiety Difficulty Not difficult at all Not difficult at all Not difficult at all        01/13/2023    9:55 AM 07/16/2022    8:57 AM 03/16/2022    8:52 AM  Depression screen PHQ 2/9  Decreased Interest 0 0 0  Down, Depressed, Hopeless 0 0 0  PHQ - 2 Score 0 0 0  Altered sleeping 0 0   Tired, decreased energy 0 0   Change in appetite 0 0   Feeling bad or failure about yourself  0 0   Trouble concentrating 0 0   Moving slowly or fidgety/restless 0 0   Suicidal thoughts 0 0   PHQ-9 Score 0 0   Difficult doing work/chores Not difficult at all Not difficult at all     BP Readings from Last 3 Encounters:  01/13/23 120/76  07/16/22 110/80  01/13/22 120/80    Physical Exam Vitals and nursing note reviewed.  HENT:     Head: Normocephalic.     Right Ear: Tympanic membrane, ear canal and external ear normal.     Left Ear: Tympanic membrane, ear canal and external ear normal.     Nose: Nose normal. No congestion or rhinorrhea.     Mouth/Throat:     Mouth: Mucous membranes are moist.  Eyes:     General: No scleral icterus.       Right eye: No discharge.        Left eye: No discharge.     Conjunctiva/sclera: Conjunctivae normal.     Pupils: Pupils are equal, round, and reactive to light.  Neck:     Thyroid: No thyromegaly.     Vascular: No JVD.     Trachea: No tracheal deviation.  Cardiovascular:     Rate and Rhythm: Normal rate and regular rhythm.     Heart sounds: Normal heart sounds. No murmur heard.    No friction rub. No gallop.  Pulmonary:     Effort: No respiratory distress.     Breath sounds: Normal breath sounds. No wheezing or rales.  Abdominal:     General: Bowel sounds are normal.     Palpations: Abdomen is soft. There is no mass.     Tenderness: There is no abdominal tenderness. There is no guarding or rebound.  Musculoskeletal:         General: No tenderness. Normal range of motion.     Cervical back: Normal range of motion and neck supple.  Lymphadenopathy:     Cervical: No  cervical adenopathy.  Skin:    General: Skin is warm.     Findings: No rash.  Neurological:     Mental Status: He is alert and oriented to person, place, and time.     Cranial Nerves: No cranial nerve deficit.     Deep Tendon Reflexes: Reflexes are normal and symmetric.     Wt Readings from Last 3 Encounters:  01/13/23 197 lb (89.4 kg)  07/16/22 186 lb (84.4 kg)  01/13/22 186 lb (84.4 kg)    BP 120/76   Pulse 95   Ht 6\' 3"  (1.905 m)   Wt 197 lb (89.4 kg)   SpO2 98%   BMI 24.62 kg/m   Assessment and Plan: 1. Familial hypercholesterolemia Chronic.  Controlled.  Stable.  Continue Zetia 10 mg once a day.  Will check lipid panel for LDL control and CMP monitoring of transaminases. - ezetimibe (ZETIA) 10 MG tablet; TAKE 1 TABLET(10 MG) BY MOUTH DAILY  Dispense: 90 tablet; Refill: 1 - Lipid Panel With LDL/HDL Ratio - Comprehensive Metabolic Panel (CMET)  2. History of bladder cancer Previous history of bladder cancer followed at [facility] initially and and most recently been followed by urology/Dr. Eliberto Ivory in [place] recent retirement of Dr. Eliberto Ivory and we will refer to urology for follow-up in the future. - Ambulatory referral to Urology  3. Benign prostatic hyperplasia without lower urinary tract symptoms .  Controlled.  Stable.  Asymptomatic.  Followed by Dr. Birdena Crandall in [place] in the past and we will continue with referral to urology to be followed in the future. - Ambulatory referral to Urology  4. Erectile dysfunction, unspecified erectile dysfunction type Chronic.  Controlled.  Stable.  Followed by urology in the past. - Ambulatory referral to Urology  5. Taking medication for chronic disease Patient currently taking Zetia and will follow transaminases and lipid panel. - Comprehensive Metabolic Panel (CMET)     Otilio Miu,  MD

## 2023-01-14 ENCOUNTER — Ambulatory Visit: Payer: Medicare Other | Admitting: Family Medicine

## 2023-01-14 LAB — COMPREHENSIVE METABOLIC PANEL
ALT: 23 IU/L (ref 0–44)
AST: 31 IU/L (ref 0–40)
Albumin/Globulin Ratio: 1.6 (ref 1.2–2.2)
Albumin: 4.3 g/dL (ref 3.8–4.8)
Alkaline Phosphatase: 55 IU/L (ref 44–121)
BUN/Creatinine Ratio: 12 (ref 10–24)
BUN: 13 mg/dL (ref 8–27)
Bilirubin Total: 0.6 mg/dL (ref 0.0–1.2)
CO2: 23 mmol/L (ref 20–29)
Calcium: 9.5 mg/dL (ref 8.6–10.2)
Chloride: 104 mmol/L (ref 96–106)
Creatinine, Ser: 1.1 mg/dL (ref 0.76–1.27)
Globulin, Total: 2.7 g/dL (ref 1.5–4.5)
Glucose: 88 mg/dL (ref 70–99)
Potassium: 4.7 mmol/L (ref 3.5–5.2)
Sodium: 141 mmol/L (ref 134–144)
Total Protein: 7 g/dL (ref 6.0–8.5)
eGFR: 71 mL/min/{1.73_m2} (ref >59)

## 2023-01-14 LAB — LIPID PANEL WITH LDL/HDL RATIO
Cholesterol, Total: 231 mg/dL — ABNORMAL HIGH (ref 100–199)
HDL: 62 mg/dL (ref >39)
LDL Chol Calc (NIH): 155 mg/dL — ABNORMAL HIGH (ref 0–99)
LDL/HDL Ratio: 2.5 ratio (ref 0.0–3.6)
Triglycerides: 82 mg/dL (ref 0–149)
VLDL Cholesterol Cal: 14 mg/dL (ref 5–40)

## 2023-01-15 DIAGNOSIS — H5203 Hypermetropia, bilateral: Secondary | ICD-10-CM | POA: Diagnosis not present

## 2023-01-15 DIAGNOSIS — H401221 Low-tension glaucoma, left eye, mild stage: Secondary | ICD-10-CM | POA: Diagnosis not present

## 2023-01-15 DIAGNOSIS — H401212 Low-tension glaucoma, right eye, moderate stage: Secondary | ICD-10-CM | POA: Diagnosis not present

## 2023-01-15 DIAGNOSIS — H2513 Age-related nuclear cataract, bilateral: Secondary | ICD-10-CM | POA: Diagnosis not present

## 2023-02-10 ENCOUNTER — Ambulatory Visit (INDEPENDENT_AMBULATORY_CARE_PROVIDER_SITE_OTHER): Payer: Medicare Other | Admitting: Urology

## 2023-02-10 ENCOUNTER — Telehealth: Payer: Self-pay | Admitting: *Deleted

## 2023-02-10 ENCOUNTER — Encounter: Payer: Self-pay | Admitting: Urology

## 2023-02-10 VITALS — BP 150/86 | HR 55 | Ht 75.0 in | Wt 182.0 lb

## 2023-02-10 DIAGNOSIS — N529 Male erectile dysfunction, unspecified: Secondary | ICD-10-CM

## 2023-02-10 DIAGNOSIS — N400 Enlarged prostate without lower urinary tract symptoms: Secondary | ICD-10-CM

## 2023-02-10 DIAGNOSIS — Z125 Encounter for screening for malignant neoplasm of prostate: Secondary | ICD-10-CM | POA: Diagnosis not present

## 2023-02-10 LAB — BLADDER SCAN AMB NON-IMAGING: Scan Result: 0

## 2023-02-10 LAB — URINALYSIS, COMPLETE
Bilirubin, UA: NEGATIVE
Glucose, UA: NEGATIVE
Ketones, UA: NEGATIVE
Leukocytes,UA: NEGATIVE
Nitrite, UA: NEGATIVE
Protein,UA: NEGATIVE
RBC, UA: NEGATIVE
Specific Gravity, UA: >1.03 — ABNORMAL HIGH (ref 1.005–1.030)
Urobilinogen, Ur: 0.2 mg/dL (ref 0.2–1.0)
pH, UA: 5.5 (ref 5.0–7.5)

## 2023-02-10 LAB — MICROSCOPIC EXAMINATION

## 2023-02-10 MED ORDER — SILDENAFIL CITRATE 20 MG PO TABS: ORAL_TABLET | ORAL | 3 refills | Status: DC

## 2023-02-10 NOTE — Telephone Encounter (Signed)
-----   Message from Riki Altes, MD sent at 02/10/2023  1:13 PM EDT ----- Regarding: PSA Please let patient know that Dr. Yetta Barre did not check his PSA with his last blood draw.  If he wants to have it drawn can schedule a lab visit or can check at next year's visit

## 2023-02-10 NOTE — Progress Notes (Signed)
I, Patrick Mcknight,acting as a scribe for Patrick Altes, MD.,have documented all relevant documentation on the behalf of Patrick Altes, MD,as directed by  Patrick Altes, MD while in the presence of Patrick Altes, MD.   02/10/23 9:25 AM   Patrick Mcknight 03-17-1951 [id number]  Referring provider: Duanne Limerick, MD [address] [place],  [place] [address]  Chief Complaint  Patient presents with   Benign Prostatic Hypertrophy    HPI: Patrick Mcknight is a 72 y.o. male here to transfer urologic care after recent retirement of his previous urologist.  Followed by Patrick Mcknight for BPH and erectile dysfunction Uses Sildenafil 20 mg daily and supplements with 40 mg on the day of sexual activity Last saw Patrick Mcknight 12/22/21 and PSA at that visit was 0.7 Since his last visit with Patrick Mcknight he states he has been doing well  Prior history of stone disease Since his last visit with Patrick Mcknight he states he has been doing well  Continues on Sildenafil No bothersome LUTS Denies dysuria, gross hematuria Denies flank, abdominal or pelvic pain  IPSS 0/35 He states a PSA was drawn by his PCP last month, however, it does not appear a PSA was ordered. He states he has a history of bladder cancer treated by Dr. Colon Mcknight at [facility] in [facility]. Review of those records was remarkable for a UPJ obstruction treated with endopyelotomy and no evidence of cancer.   PMH: Past Medical History:  Diagnosis Date   Erectile dysfunction    GERD (gastroesophageal reflux disease)     Surgical History: Past Surgical History:  Procedure Laterality Date   URETHRA SURGERY  1990's   stent placement    Home Medications:  Allergies as of 02/10/2023       Reactions   Crestor [rosuvastatin]    kidney pain        Medication List        Accurate as of February 10, 2023  9:25 AM. If you have any questions, ask your nurse or doctor.          aspirin EC 81 MG tablet Take 81 mg by mouth daily.    brimonidine 0.2 % ophthalmic solution Commonly known as: ALPHAGAN SMARTSIG:In Eye(s)   ezetimibe 10 MG tablet Commonly known as: ZETIA TAKE 1 TABLET(10 MG) BY MOUTH DAILY   FISH OIL CONCENTRATE PO Take by mouth.   latanoprost 0.005 % ophthalmic solution Commonly known as: XALATAN Place 1 drop into both eyes at bedtime.   multivitamin tablet Take 1 tablet by mouth daily.   sildenafil 20 MG tablet Commonly known as: REVATIO As directed Started by: Patrick Altes, MD   timolol 0.25 % ophthalmic solution Commonly known as: TIMOPTIC Place 1 drop into both eyes every morning.   Vitamin D3 125 MCG (5000 UT) Caps Take 1 capsule by mouth daily.   vitamin E 180 MG (400 UNITS) capsule Take 400 Units by mouth daily.        Allergies:  Allergies  Allergen Reactions   Crestor [Rosuvastatin]     kidney pain    Family History: Family History  Family history unknown: Yes    Social History:  reports that he has never smoked. He has never used smokeless tobacco. He reports that he does not currently use alcohol. He reports that he does not use drugs.   Physical Exam: BP (!) 150/86   Pulse (!) 55   Ht  (1.905  m)   Wt 182 lb (82.6 kg)   BMI 22.75 kg/m   Constitutional:  Alert and oriented, No acute distress. HEENT: [place] AT Respiratory: Normal respiratory effort, no increased work of breathing. Psychiatric: Normal mood and affect.  Laboratory Data:  Urinalysis Dipstick/microscopy negative   Assessment & Plan:    BPH without LUTS   Erectile dysfunction Stable on Sildenafil which was refilled  3. Prostate cancer screening We discussed current prostate cancer screening guidelines Recommend PSA annually until age 62 and starting at age 58. His PSA has been low and stable. It would be okay to discontinue prostate cancer screening. Healthy patients can extend until age 1.  He will be contacted to let him know a PSA was not drawn by his PCP. If he desires to  have it drawn, we'll schedule a lab visit  Follow up annually  I have reviewed the above documentation for accuracy and completeness, and I agree with the above.   Patrick Altes, MD  [facility] [address] [place], [place] [address] [phone]

## 2023-02-11 NOTE — Telephone Encounter (Signed)
Patient returned call and said he will have his PSA checked at next year's visit.

## 2023-02-24 ENCOUNTER — Telehealth: Payer: Self-pay | Admitting: Family Medicine

## 2023-02-24 NOTE — Telephone Encounter (Signed)
Contacted Patrick Mcknight to schedule their annual wellness visit. Appointment made for 03/18/2023.  Verlee Rossetti; Care Guide Ambulatory Clinical Support [place] [facility] Direct Dial: [phone]

## 2023-02-24 NOTE — Telephone Encounter (Signed)
Copied from CRM [id number]. Topic: Medicare AWV >> Feb 24, 2023  1:33 PM Payton Doughty wrote: Reason for CRM: Called patient to schedule Medicare Annual Wellness Visit (AWV). Left message for patient to call back and schedule Medicare Annual Wellness Visit (AWV).  Last date of AWV: 03/16/22  Please schedule an appointment at any time with Kennedy Bucker, LPN  .  If any questions, please contact me.  Thank you ,  Verlee Rossetti; Care Guide Ambulatory Clinical Support [place] [facility] Direct Dial: [phone]

## 2023-03-02 ENCOUNTER — Telehealth: Payer: Self-pay | Admitting: Family Medicine

## 2023-03-02 NOTE — Telephone Encounter (Signed)
Copied from CRM [id number]. Topic: General - Other >> Mar 02, 2023  8:22 AM Franchot Heidelberg wrote: Reason for CRM: Pt is requesting a call back from Delice Bison [phone], no further info disclosed when asked.

## 2023-03-05 ENCOUNTER — Ambulatory Visit (INDEPENDENT_AMBULATORY_CARE_PROVIDER_SITE_OTHER): Payer: Medicare Other | Admitting: Family Medicine

## 2023-03-05 ENCOUNTER — Encounter: Payer: Self-pay | Admitting: Family Medicine

## 2023-03-05 VITALS — BP 110/68 | HR 52 | Ht 75.0 in | Wt 191.0 lb

## 2023-03-05 DIAGNOSIS — B351 Tinea unguium: Secondary | ICD-10-CM

## 2023-03-05 NOTE — Progress Notes (Signed)
Date:  03/05/2023   Name:  Patrick Mcknight   DOB:  [birth year]   MRN:  [id number]   Chief Complaint: Nail Problem  Leg Pain  Incident onset: chronic. There was no injury mechanism (that remembers). The pain is present in the right toes (great toe). The pain is moderate (difficulty to wear shoes). The pain has been Constant since onset. Pertinent negatives include no numbness or tingling. The symptoms are aggravated by palpation. He has tried nothing for the symptoms.    Lab Results  Component Value Date   NA 141 01/13/2023   K 4.7 01/13/2023   CO2 23 01/13/2023   GLUCOSE 88 01/13/2023   BUN 13 01/13/2023   CREATININE 1.10 01/13/2023   CALCIUM 9.5 01/13/2023   EGFR 71 01/13/2023   GFRNONAA 66 04/23/2020   Lab Results  Component Value Date   CHOL 231 (H) 01/13/2023   HDL 62 01/13/2023   LDLCALC 155 (H) 01/13/2023   TRIG 82 01/13/2023   CHOLHDL 4.3 03/15/2019   Lab Results  Component Value Date   TSH 1.010 04/14/2019   No results found for: "HGBA1C" Lab Results  Component Value Date   WBC 5.9 03/13/2021   HGB 14.7 03/13/2021   HCT 41.5 03/13/2021   MCV 90 03/13/2021   PLT 287 03/13/2021   Lab Results  Component Value Date   ALT 23 01/13/2023   AST 31 01/13/2023   ALKPHOS 55 01/13/2023   BILITOT 0.6 01/13/2023   No results found for: "25OHVITD2", "25OHVITD3", "VD25OH"   Review of Systems  Constitutional:  Negative for chills and fever.  Respiratory:  Negative for cough, shortness of breath and wheezing.   Cardiovascular:  Negative for chest pain, palpitations and leg swelling.  Gastrointestinal:  Negative for abdominal pain and nausea.  Endocrine: Negative for polydipsia and polyuria.  Genitourinary:  Negative for urgency.  Musculoskeletal:  Negative for neck pain.  Skin:  Negative for rash.  Allergic/Immunologic: Negative for environmental allergies.  Neurological:  Negative for dizziness, tingling, numbness and headaches.  Hematological:  Does not  bruise/bleed easily.  Psychiatric/Behavioral:  Negative for suicidal ideas. The patient is not nervous/anxious.     Patient Active Problem List   Diagnosis Date Noted   Primary open angle glaucoma of left eye, mild stage 05/26/2021   Primary open angle glaucoma of right eye, moderate stage 05/26/2021   Aortic atherosclerosis ([facility]) 12/15/2018   Glaucoma 03/09/2017   Diverticulosis 02/14/2015   Irritable bowel syndrome (IBS) 02/14/2015   Hx of iron deficiency anemia 02/14/2015   Hyperlipemia intolerant statins 02/14/2015   Vitamin D deficiency 02/14/2015   Erectile dysfunction 02/14/2015   Ureteral stricture s/p stent 02/14/2015    Allergies  Allergen Reactions   Crestor [Rosuvastatin]     kidney pain    Past Surgical History:  Procedure Laterality Date   URETHRA SURGERY  1990's   stent placement    Social History   Tobacco Use   Smoking status: Never   Smokeless tobacco: Never  Vaping Use   Vaping Use: Never used  Substance Use Topics   Alcohol use: Not Currently    Alcohol/week: 0.0 standard drinks of alcohol   Drug use: No     Medication list has been reviewed and updated.  No outpatient medications have been marked as taking for the 03/05/23 encounter (Office Visit) with Duanne Limerick, MD.       03/05/2023    9:12 AM 01/13/2023    9:55 AM 07/16/2022  8:58 AM 01/13/2022    9:01 AM  GAD 7 : Generalized Anxiety Score  Nervous, Anxious, on Edge 0 0 0 0  Control/stop worrying 0 0 0 0  Worry too much - different things 0 0 0 0  Trouble relaxing 0 0 0 0  Restless 0 0 0 0  Easily annoyed or irritable 0 0 0 0  Afraid - awful might happen 0 0 0 0  Total GAD 7 Score 0 0 0 0  Anxiety Difficulty Not difficult at all Not difficult at all Not difficult at all Not difficult at all       03/05/2023    9:12 AM 01/13/2023    9:55 AM 07/16/2022    8:57 AM  Depression screen PHQ 2/9  Decreased Interest 0 0 0  Down, Depressed, Hopeless 0 0 0  PHQ - 2 Score 0 0 0   Altered sleeping 0 0 0  Tired, decreased energy 0 0 0  Change in appetite 0 0 0  Feeling bad or failure about yourself  0 0 0  Trouble concentrating 0 0 0  Moving slowly or fidgety/restless 0 0 0  Suicidal thoughts 0 0 0  PHQ-9 Score 0 0 0  Difficult doing work/chores  Not difficult at all Not difficult at all    BP Readings from Last 3 Encounters:  03/05/23 110/68  02/10/23 (!) 150/86  01/13/23 120/76    Physical Exam Vitals and nursing note reviewed.  HENT:     Head: Normocephalic.     Right Ear: External ear normal.     Left Ear: External ear normal.     Nose: Nose normal.  Eyes:     General: No scleral icterus.       Right eye: No discharge.        Left eye: No discharge.     Conjunctiva/sclera: Conjunctivae normal.     Pupils: Pupils are equal, round, and reactive to light.  Neck:     Thyroid: No thyromegaly.     Vascular: No JVD.     Trachea: No tracheal deviation.  Cardiovascular:     Rate and Rhythm: Normal rate and regular rhythm.     Heart sounds: Normal heart sounds. No murmur heard.    No friction rub. No gallop.  Pulmonary:     Effort: No respiratory distress.     Breath sounds: Normal breath sounds. No wheezing or rales.  Abdominal:     General: Bowel sounds are normal.     Palpations: Abdomen is soft. There is no mass.     Tenderness: There is no abdominal tenderness. There is no guarding or rebound.  Musculoskeletal:        General: No tenderness. Normal range of motion.     Cervical back: Normal range of motion and neck supple.  Feet:     Right foot:     Skin integrity: Skin integrity normal. No ulcer, blister, warmth, callus or dry skin.     Toenail Condition: Right toenails are abnormally thick. Fungal disease present.    Comments: Discolored/possible onychomycosis Lymphadenopathy:     Cervical: No cervical adenopathy.  Skin:    General: Skin is warm.     Findings: No rash.  Neurological:     Mental Status: He is alert and oriented to  person, place, and time.     Cranial Nerves: No cranial nerve deficit.     Deep Tendon Reflexes: Reflexes are normal and symmetric.  Wt Readings from Last 3 Encounters:  03/05/23 191 lb (86.6 kg)  02/10/23 182 lb (82.6 kg)  01/13/23 197 lb (89.4 kg)    BP 110/68   Pulse (!) 52   Ht 6\' 3"  (1.905 m)   Wt 191 lb (86.6 kg)   SpO2 99%   BMI 23.87 kg/m   Assessment and Plan:  1. Onychomycosis Chronic.  Persistent.  Gradually worsening in terms of buildup in thickness and now with pain when wearing shoes and palpation.  No history of injury and wondering if this is suggesting more of a fungal infestation.  Will have podiatry evaluate for removal of debris and determination of further treatment if necessary. - Ambulatory referral to Podiatry    Elizabeth Sauer, MD

## 2023-03-08 DIAGNOSIS — H401112 Primary open-angle glaucoma, right eye, moderate stage: Secondary | ICD-10-CM | POA: Diagnosis not present

## 2023-03-08 DIAGNOSIS — H401121 Primary open-angle glaucoma, left eye, mild stage: Secondary | ICD-10-CM | POA: Diagnosis not present

## 2023-03-08 DIAGNOSIS — H2513 Age-related nuclear cataract, bilateral: Secondary | ICD-10-CM | POA: Diagnosis not present

## 2023-03-24 ENCOUNTER — Ambulatory Visit (INDEPENDENT_AMBULATORY_CARE_PROVIDER_SITE_OTHER): Payer: Medicare Other

## 2023-03-24 VITALS — Ht 75.0 in | Wt 191.0 lb

## 2023-03-24 DIAGNOSIS — Z0000 Encounter for general adult medical examination without abnormal findings: Secondary | ICD-10-CM | POA: Diagnosis not present

## 2023-03-24 NOTE — Progress Notes (Signed)
I connected with  [name] on 03/24/23 by a audio enabled telemedicine application and verified that I am speaking with the correct person using two identifiers.  Patient Location: Home  Provider Location: Office/Clinic  I discussed the limitations of evaluation and management by telemedicine. The patient expressed understanding and agreed to proceed.  Subjective:   Patrick Mcknight is a 72 y.o. male who presents for Medicare Annual/Subsequent preventive examination.  Review of Systems     Cardiac Risk Factors include: advanced age (>31men, >68 women);male gender;hypertension;dyslipidemia     Objective:    There were no vitals filed for this visit. There is no height or weight on file to calculate BMI.     03/24/2023    8:19 AM 03/16/2022    8:53 AM 03/12/2021    8:58 AM 03/11/2020    8:36 AM  Advanced Directives  Does Patient Have a Medical Advance Directive? No No No No  Does patient want to make changes to medical advance directive?   No - Patient declined   Would patient like information on creating a medical advance directive? No - Patient declined Yes (MAU/Ambulatory/Procedural Areas - Information given)  Yes (MAU/Ambulatory/Procedural Areas - Information given)    Current Medications (verified) Outpatient Encounter Medications as of 03/24/2023  Medication Sig   aspirin EC 81 MG tablet Take 81 mg by mouth daily.   brimonidine (ALPHAGAN) 0.2 % ophthalmic solution SMARTSIG:In Eye(s)   Cholecalciferol (VITAMIN D3) 5000 units CAPS Take 1 capsule by mouth daily.   ezetimibe (ZETIA) 10 MG tablet TAKE 1 TABLET(10 MG) BY MOUTH DAILY   latanoprost (XALATAN) 0.005 % ophthalmic solution Place 1 drop into both eyes at bedtime.   Multiple Vitamin (MULTIVITAMIN) tablet Take 1 tablet by mouth daily.   Omega-3 Fatty Acids (FISH OIL CONCENTRATE PO) Take by mouth.   sildenafil (REVATIO) 20 MG tablet Take 1 tablet daily.   timolol (TIMOPTIC) 0.25 % ophthalmic solution Place 1 drop  into both eyes every morning.   vitamin E 180 MG (400 UNITS) capsule Take 400 Units by mouth daily.   No facility-administered encounter medications on file as of 03/24/2023.    Allergies (verified) Crestor [rosuvastatin]   History: Past Medical History:  Diagnosis Date   Erectile dysfunction    GERD (gastroesophageal reflux disease)    Past Surgical History:  Procedure Laterality Date   URETHRA SURGERY  1990's   stent placement   Family History  Family history unknown: Yes   Social History   Socioeconomic History   Marital status: Widowed    Spouse name: Not on file   Number of children: 2   Years of education: Not on file   Highest education level: Not on file  Occupational History   Not on file  Tobacco Use   Smoking status: Never   Smokeless tobacco: Never  Vaping Use   Vaping Use: Never used  Substance and Sexual Activity   Alcohol use: Not Currently    Alcohol/week: 0.0 standard drinks of alcohol   Drug use: No   Sexual activity: Yes  Other Topics Concern   Not on file  Social History Narrative   2 adopted children.    Social Determinants of Health   Financial Resource Strain: Low Risk  (03/24/2023)   Overall Financial Resource Strain (CARDIA)    Difficulty of Paying Living Expenses: Not hard at all  Food Insecurity: No Food Insecurity (03/24/2023)   Hunger Vital Sign    Worried About Running Out of  Food in the Last Year: Never true    Ran Out of Food in the Last Year: Never true  Transportation Needs: No Transportation Needs (03/24/2023)   PRAPARE - Administrator, Civil Service (Medical): No    Lack of Transportation (Non-Medical): No  Physical Activity: Sufficiently Active (03/24/2023)   Exercise Vital Sign    Days of Exercise per Week: 7 days    Minutes of Exercise per Session: 40 min  Stress: No Stress Concern Present (03/24/2023)   [employer] of Occupational Health - Occupational Stress Questionnaire    Feeling of Stress : Not  at all  Social Connections: Moderately Isolated (03/24/2023)   Social Connection and Isolation Panel [NHANES]    Frequency of Communication with Friends and Family: More than three times a week    Frequency of Social Gatherings with Friends and Family: More than three times a week    Attends Religious Services: More than 4 times per year    Active Member of [employer] or Organizations: No    Attends Banker Meetings: Not on file    Marital Status: Widowed    Tobacco Counseling Counseling given: Not Answered   Clinical Intake:  Pre-visit preparation completed: Yes  Pain : No/denies pain     Nutritional Risks: None Diabetes: No  How often do you need to have someone help you when you read instructions, pamphlets, or other written materials from your doctor or pharmacy?: 1 - Never  Diabetic?no  Interpreter Needed?: No  Information entered by :: Kennedy Bucker, LPN   Activities of Daily Living    03/24/2023    8:21 AM  In your present state of health, do you have any difficulty performing the following activities:  Hearing? 0  Vision? 0  Difficulty concentrating or making decisions? 0  Walking or climbing stairs? 0  Dressing or bathing? 0  Doing errands, shopping? 0  Preparing Food and eating ? N  Using the Toilet? N  In the past six months, have you accidently leaked urine? N  Do you have problems with loss of bowel control? N  Managing your Medications? N  Managing your Finances? N  Housekeeping or managing your Housekeeping? N    Patient Care Team: Duanne Limerick, MD as PCP - General (Family Medicine) Orson Ape, MD as Consulting Physician (Urology)  Indicate any recent Medical Services you may have received from other than [facility] providers in the past year (date may be approximate).     Assessment:   This is a routine wellness examination for Patrick Mcknight.  Hearing/Vision screen Hearing Screening - Comments:: No aids Vision Screening -  Comments:: Wears glasses- Dr.Nice- just had laser surgery 01/06/23  Dietary issues and exercise activities discussed: Current Exercise Habits: Home exercise routine, Type of exercise: walking, Time (Minutes): 40, Frequency (Times/Week): 7, Weekly Exercise (Minutes/Week): 280, Intensity: Mild   Goals Addressed             This Visit's Progress    DIET - EAT MORE FRUITS AND VEGETABLES         Depression Screen    03/24/2023    8:18 AM 03/05/2023    9:12 AM 01/13/2023    9:55 AM 07/16/2022    8:57 AM 03/16/2022    8:52 AM 01/13/2022    9:01 AM 07/16/2021    9:24 AM  PHQ 2/9 Scores  PHQ - 2 Score 0 0 0 0 0 0 0  PHQ- 9 Score 0 0 0  0  0 0    Fall Risk    03/24/2023    8:20 AM 03/05/2023    9:12 AM 01/13/2023    9:55 AM 07/16/2022    8:57 AM 03/16/2022    8:55 AM  Fall Risk   Falls in the past year? 0 0 0 0 0  Number falls in past yr: 0 0 0 0 0  Injury with Fall? 0 0 0 0 0  Risk for fall due to : No Fall Risks No Fall Risks No Fall Risks No Fall Risks No Fall Risks  Follow up Falls prevention discussed;Falls evaluation completed Falls evaluation completed Falls evaluation completed Falls evaluation completed Falls prevention discussed    FALL RISK PREVENTION PERTAINING TO THE HOME:  Any stairs in or around the home? No  If so, are there any without handrails? No  Home free of loose throw rugs in walkways, pet beds, electrical cords, etc? Yes  Adequate lighting in your home to reduce risk of falls? Yes   ASSISTIVE DEVICES UTILIZED TO PREVENT FALLS:  Life alert? No  Use of a cane, walker or w/c? No  Grab bars in the bathroom? No  Shower chair or bench in shower? No  Elevated toilet seat or a handicapped toilet? Yes    Cognitive Function:        03/24/2023    8:25 AM  6CIT Screen  What Year? 0 points  What month? 0 points  What time? 0 points  Count back from 20 2 points  Months in reverse 0 points  Repeat phrase 2 points  Total Score 4 points     Immunizations Immunization History  Administered Date(s) Administered   Fluad Quad(high Dose 65+) 08/01/2019, 07/16/2022   Influenza, High Dose Seasonal PF 08/22/2020   PFIZER(Purple Top)SARS-COV-2 Vaccination 12/08/2019, 12/29/2019, 08/22/2020, 02/10/2021   Pneumococcal Conjugate-13 03/10/2016   Pneumococcal Polysaccharide-23 03/09/2017   Zoster Recombinat (Shingrix) 01/14/2022, 05/06/2022    TDAP status: Due, Education has been provided regarding the importance of this vaccine. Advised may receive this vaccine at local pharmacy or Health Dept. Aware to provide a copy of the vaccination record if obtained from local pharmacy or Health Dept. Verbalized acceptance and understanding.  Flu Vaccine status: Up to date  Pneumococcal vaccine status: Up to date  Covid-19 vaccine status: Completed vaccines  Qualifies for Shingles Vaccine? Yes   Zostavax completed No   Shingrix Completed?: Yes  Screening Tests Health Maintenance  Topic Date Due   COVID-19 Vaccine (5 - 2023-24 season) 06/26/2022   INFLUENZA VACCINE  05/27/2023   Medicare Annual Wellness (AWV)  03/23/2024   Colonoscopy  06/06/2030   Pneumonia Vaccine 73+ Years old  Completed   Hepatitis C Screening  Completed   Zoster Vaccines- Shingrix  Completed   HPV VACCINES  Aged Out   DTaP/Tdap/Td  Discontinued    Health Maintenance  Health Maintenance Due  Topic Date Due   COVID-19 Vaccine (5 - 2023-24 season) 06/26/2022    Colorectal cancer screening: Type of screening: Colonoscopy. Completed 06/06/20. Repeat every 10 years  Lung Cancer Screening: (Low Dose CT Chest recommended if Age 38-80 years, 30 pack-year currently smoking OR have quit w/in 15years.) does not qualify.   Additional Screening:  Hepatitis C Screening: does qualify; Completed 03/07/18  Vision Screening: Recommended annual ophthalmology exams for early detection of glaucoma and other disorders of the eye. Is the patient up to date with their  annual eye exam?  Yes  Who is the provider or  what is the name of the office in which the patient attends annual eye exams? Dr. Larence Penning If pt is not established with a provider, would they like to be referred to a provider to establish care? No .   Dental Screening: Recommended annual dental exams for proper oral hygiene  Community Resource Referral / Chronic Care Management: CRR required this visit?  No   CCM required this visit?  No      Plan:     I have personally reviewed and noted the following in the patient's chart:   Medical and social history Use of alcohol, tobacco or illicit drugs  Current medications and supplements including opioid prescriptions. Patient is not currently taking opioid prescriptions. Functional ability and status Nutritional status Physical activity Advanced directives List of other physicians Hospitalizations, surgeries, and ER visits in previous 12 months Vitals Screenings to include cognitive, depression, and falls Referrals and appointments  In addition, I have reviewed and discussed with patient certain preventive protocols, quality metrics, and best practice recommendations. A written personalized care plan for preventive services as well as general preventive health recommendations were provided to patient.     Hal Hope, LPN   [id number]/[id number]   Nurse Notes: none

## 2023-03-24 NOTE — Patient Instructions (Signed)
Mr. Patrick Mcknight , Thank you for taking time to come for your Medicare Wellness Visit. I appreciate your ongoing commitment to your health goals. Please review the following plan we discussed and let me know if I can assist you in the future.   These are the goals we discussed:  Goals      DIET - EAT MORE FRUITS AND VEGETABLES     Patient Stated     Patient states he would like to remain healthy and active.         This is a list of the screening recommended for you and due dates:  Health Maintenance  Topic Date Due   COVID-19 Vaccine (5 - 2023-24 season) 06/26/2022   Flu Shot  05/27/2023   Medicare Annual Wellness Visit  03/23/2024   Colon Cancer Screening  06/06/2030   Pneumonia Vaccine  Completed   Hepatitis C Screening  Completed   Zoster (Shingles) Vaccine  Completed   HPV Vaccine  Aged Out   DTaP/Tdap/Td vaccine  Discontinued    Advanced directives: no  Conditions/risks identified: none  Next appointment: Follow up in one year for your annual wellness visit. 03/29/24 @ 8:15 am by phone  Preventive Care 65 Years and Older, Male  Preventive care refers to lifestyle choices and visits with your health care provider that can promote health and wellness. What does preventive care include? A yearly physical exam. This is also called an annual well check. Dental exams once or twice a year. Routine eye exams. Ask your health care provider how often you should have your eyes checked. Personal lifestyle choices, including: Daily care of your teeth and gums. Regular physical activity. Eating a healthy diet. Avoiding tobacco and drug use. Limiting alcohol use. Practicing safe sex. Taking low doses of aspirin every day. Taking vitamin and mineral supplements as recommended by your health care provider. What happens during an annual well check? The services and screenings done by your health care provider during your annual well check will depend on your age, overall health,  lifestyle risk factors, and family history of disease. Counseling  Your health care provider may ask you questions about your: Alcohol use. Tobacco use. Drug use. Emotional well-being. Home and relationship well-being. Sexual activity. Eating habits. History of falls. Memory and ability to understand (cognition). Work and work Astronomer. Screening  You may have the following tests or measurements: Height, weight, and BMI. Blood pressure. Lipid and cholesterol levels. These may be checked every 5 years, or more frequently if you are over 63 years old. Skin check. Lung cancer screening. You may have this screening every year starting at age 72 if you have a 30-pack-year history of smoking and currently smoke or have quit within the past 15 years. Fecal occult blood test (FOBT) of the stool. You may have this test every year starting at age 72. Flexible sigmoidoscopy or colonoscopy. You may have a sigmoidoscopy every 5 years or a colonoscopy every 10 years starting at age 72. Prostate cancer screening. Recommendations will vary depending on your family history and other risks. Hepatitis C blood test. Hepatitis B blood test. Sexually transmitted disease (STD) testing. Diabetes screening. This is done by checking your blood sugar (glucose) after you have not eaten for a while (fasting). You may have this done every 1-3 years. Abdominal aortic aneurysm (AAA) screening. You may need this if you are a current or former smoker. Osteoporosis. You may be screened starting at age 72 if you are at high risk.  Talk with your health care provider about your test results, treatment options, and if necessary, the need for more tests. Vaccines  Your health care provider may recommend certain vaccines, such as: Influenza vaccine. This is recommended every year. Tetanus, diphtheria, and acellular pertussis (Tdap, Td) vaccine. You may need a Td booster every 10 years. Zoster vaccine. You may need this  after age 71. Pneumococcal 13-valent conjugate (PCV13) vaccine. One dose is recommended after age 72. Pneumococcal polysaccharide (PPSV23) vaccine. One dose is recommended after age 72. Talk to your health care provider about which screenings and vaccines you need and how often you need them. This information is not intended to replace advice given to you by your health care provider. Make sure you discuss any questions you have with your health care provider. Document Released: 11/08/2015 Document Revised: 07/01/2016 Document Reviewed: 08/13/2015 Elsevier Interactive Patient Education  2017 [employer].  Fall Prevention in the Home Falls can cause injuries. They can happen to people of all ages. There are many things you can do to make your home safe and to help prevent falls. What can I do on the outside of my home? Regularly fix the edges of walkways and driveways and fix any cracks. Remove anything that might make you trip as you walk through a door, such as a raised step or threshold. Trim any bushes or trees on the path to your home. Use bright outdoor lighting. Clear any walking paths of anything that might make someone trip, such as rocks or tools. Regularly check to see if handrails are loose or broken. Make sure that both sides of any steps have handrails. Any raised decks and porches should have guardrails on the edges. Have any leaves, snow, or ice cleared regularly. Use sand or salt on walking paths during winter. Clean up any spills in your garage right away. This includes oil or grease spills. What can I do in the bathroom? Use night lights. Install grab bars by the toilet and in the tub and shower. Do not use towel bars as grab bars. Use non-skid mats or decals in the tub or shower. If you need to sit down in the shower, use a plastic, non-slip stool. Keep the floor dry. Clean up any water that spills on the floor as soon as it happens. Remove soap buildup in the tub or  shower regularly. Attach bath mats securely with double-sided non-slip rug tape. Do not have throw rugs and other things on the floor that can make you trip. What can I do in the bedroom? Use night lights. Make sure that you have a light by your bed that is easy to reach. Do not use any sheets or blankets that are too big for your bed. They should not hang down onto the floor. Have a firm chair that has side arms. You can use this for support while you get dressed. Do not have throw rugs and other things on the floor that can make you trip. What can I do in the kitchen? Clean up any spills right away. Avoid walking on wet floors. Keep items that you use a lot in easy-to-reach places. If you need to reach something above you, use a strong step stool that has a grab bar. Keep electrical cords out of the way. Do not use floor polish or wax that makes floors slippery. If you must use wax, use non-skid floor wax. Do not have throw rugs and other things on the floor that can make  you trip. What can I do with my stairs? Do not leave any items on the stairs. Make sure that there are handrails on both sides of the stairs and use them. Fix handrails that are broken or loose. Make sure that handrails are as long as the stairways. Check any carpeting to make sure that it is firmly attached to the stairs. Fix any carpet that is loose or worn. Avoid having throw rugs at the top or bottom of the stairs. If you do have throw rugs, attach them to the floor with carpet tape. Make sure that you have a light switch at the top of the stairs and the bottom of the stairs. If you do not have them, ask someone to add them for you. What else can I do to help prevent falls? Wear shoes that: Do not have high heels. Have rubber bottoms. Are comfortable and fit you well. Are closed at the toe. Do not wear sandals. If you use a stepladder: Make sure that it is fully opened. Do not climb a closed stepladder. Make  sure that both sides of the stepladder are locked into place. Ask someone to hold it for you, if possible. Clearly mark and make sure that you can see: Any grab bars or handrails. First and last steps. Where the edge of each step is. Use tools that help you move around (mobility aids) if they are needed. These include: Canes. Walkers. Scooters. Crutches. Turn on the lights when you go into a dark area. Replace any light bulbs as soon as they burn out. Set up your furniture so you have a clear path. Avoid moving your furniture around. If any of your floors are uneven, fix them. If there are any pets around you, be aware of where they are. Review your medicines with your doctor. Some medicines can make you feel dizzy. This can increase your chance of falling. Ask your doctor what other things that you can do to help prevent falls. This information is not intended to replace advice given to you by your health care provider. Make sure you discuss any questions you have with your health care provider. Document Released: 08/08/2009 Document Revised: 03/19/2016 Document Reviewed: 11/16/2014 Elsevier Interactive Patient Education  2017 [employer].

## 2023-04-21 DIAGNOSIS — B351 Tinea unguium: Secondary | ICD-10-CM | POA: Diagnosis not present

## 2023-04-21 DIAGNOSIS — M79674 Pain in right toe(s): Secondary | ICD-10-CM | POA: Diagnosis not present

## 2023-04-23 DIAGNOSIS — H401212 Low-tension glaucoma, right eye, moderate stage: Secondary | ICD-10-CM | POA: Diagnosis not present

## 2023-04-23 DIAGNOSIS — H401221 Low-tension glaucoma, left eye, mild stage: Secondary | ICD-10-CM | POA: Diagnosis not present

## 2023-04-23 DIAGNOSIS — H5203 Hypermetropia, bilateral: Secondary | ICD-10-CM | POA: Diagnosis not present

## 2023-04-23 DIAGNOSIS — H2513 Age-related nuclear cataract, bilateral: Secondary | ICD-10-CM | POA: Diagnosis not present

## 2023-05-03 DIAGNOSIS — H409 Unspecified glaucoma: Secondary | ICD-10-CM | POA: Diagnosis not present

## 2023-05-05 ENCOUNTER — Encounter: Payer: Self-pay | Admitting: Internal Medicine

## 2023-05-05 ENCOUNTER — Ambulatory Visit (INDEPENDENT_AMBULATORY_CARE_PROVIDER_SITE_OTHER): Payer: Medicare Other | Admitting: Internal Medicine

## 2023-05-05 ENCOUNTER — Ambulatory Visit: Payer: Self-pay

## 2023-05-05 VITALS — BP 118/64 | HR 61 | Temp 97.3°F | Ht 75.0 in | Wt 186.0 lb

## 2023-05-05 DIAGNOSIS — K2900 Acute gastritis without bleeding: Secondary | ICD-10-CM

## 2023-05-05 DIAGNOSIS — R7989 Other specified abnormal findings of blood chemistry: Secondary | ICD-10-CM | POA: Diagnosis not present

## 2023-05-05 MED ORDER — FAMOTIDINE 20 MG PO TABS
20.0000 mg | ORAL_TABLET | Freq: Two times a day (BID) | ORAL | 0 refills | Status: DC
Start: 2023-05-05 — End: 2023-06-04

## 2023-05-05 NOTE — Telephone Encounter (Signed)
    Chief Complaint: Sudden onset of upper abdominal pain. Hurt a little yesterday. Today pain 8-9/10. Vomited x 1. "I'm laying down now and that's not like me. I'm usually up and doing things." Symptoms: Pain Frequency: Today Pertinent Negatives: Patient denies fever Disposition: [] ED /[] Urgent Care (no appt availability in office) / [x] Appointment(In office/virtual)/ []  [facility]/ [] Home Care/ [] Refused Recommended Disposition /[] [facility]/ []  Follow-up with PCP Additional Notes: No availability with PCP today, appointment made.  Reason for Disposition  [1] MILD-MODERATE pain AND [2] constant AND [3] present > 2 hours  Answer Assessment - Initial Assessment Questions 1. LOCATION: "Where does it hurt?"      Upper 2. RADIATION: "Does the pain shoot anywhere else?" (e.g., chest, back)     No 3. ONSET: "When did the pain begin?" (Minutes, hours or days ago)      Yesterday 4. SUDDEN: "Gradual or sudden onset?"     Sudden 5. PATTERN "Does the pain come and go, or is it constant?"    - If it comes and goes: "How long does it last?" "Do you have pain now?"     (Note: Comes and goes means the pain is intermittent. It goes away completely between bouts.)    - If constant: "Is it getting better, staying the same, or getting worse?"      (Note: Constant means the pain never goes away completely; most serious pain is constant and gets worse.)      Constant 6. SEVERITY: "How bad is the pain?"  (e.g., Scale 1-10; mild, moderate, or severe)    - MILD (1-3): Doesn't interfere with normal activities, abdomen soft and not tender to touch.     - MODERATE (4-7): Interferes with normal activities or awakens from sleep, abdomen tender to touch.     - SEVERE (8-10): Excruciating pain, doubled over, unable to do any normal activities.       8-9 7. RECURRENT SYMPTOM: "Have you ever had this type of stomach pain before?" If Yes, ask: "When was the last time?" and "What happened that  time?"      No 8. CAUSE: "What do you think is causing the stomach pain?"     Unsure 9. RELIEVING/AGGRAVATING FACTORS: "What makes it better or worse?" (e.g., antacids, bending or twisting motion, bowel movement)     No 10. OTHER SYMPTOMS: "Do you have any other symptoms?" (e.g., back pain, diarrhea, fever, urination pain, vomiting)       Vomited x 1  Protocols used: Abdominal Pain - Male-A-AH

## 2023-05-05 NOTE — Progress Notes (Signed)
Date:  05/05/2023   Name:  Patrick Mcknight   DOB:  1950/12/22   MRN:  [id number]   Chief Complaint: Abdominal Pain  Abdominal Pain This is a new problem. The current episode started yesterday. The onset quality is sudden. The problem has been waxing and waning. The pain is located in the epigastric region. The pain is mild. The quality of the pain is cramping. Associated symptoms include vomiting. Pertinent negatives include no constipation, diarrhea, fever or headaches.  One episode of vomiting food material without blood or coffee grounds.  No diarrhea.  No fever or chills.  No sick contacts.  Lab Results  Component Value Date   NA 141 01/13/2023   K 4.7 01/13/2023   CO2 23 01/13/2023   GLUCOSE 88 01/13/2023   BUN 13 01/13/2023   CREATININE 1.10 01/13/2023   CALCIUM 9.5 01/13/2023   EGFR 71 01/13/2023   GFRNONAA 66 04/23/2020   Lab Results  Component Value Date   CHOL 231 (H) 01/13/2023   HDL 62 01/13/2023   LDLCALC 155 (H) 01/13/2023   TRIG 82 01/13/2023   CHOLHDL 4.3 03/15/2019   Lab Results  Component Value Date   TSH 1.010 04/14/2019   No results found for: "HGBA1C" Lab Results  Component Value Date   WBC 5.9 03/13/2021   HGB 14.7 03/13/2021   HCT 41.5 03/13/2021   MCV 90 03/13/2021   PLT 287 03/13/2021   Lab Results  Component Value Date   ALT 23 01/13/2023   AST 31 01/13/2023   ALKPHOS 55 01/13/2023   BILITOT 0.6 01/13/2023   No results found for: "25OHVITD2", "25OHVITD3", "VD25OH"   Review of Systems  Constitutional:  Negative for chills, fatigue and fever.  Respiratory:  Negative for chest tightness and shortness of breath.   Cardiovascular:  Negative for chest pain.  Gastrointestinal:  Positive for abdominal pain and vomiting. Negative for constipation and diarrhea.  Neurological:  Negative for dizziness and headaches.    Patient Active Problem List   Diagnosis Date Noted   Primary open angle glaucoma of left eye, mild stage 05/26/2021    Primary open angle glaucoma of right eye, moderate stage 05/26/2021   Aortic atherosclerosis (HCC) 12/15/2018   Glaucoma 03/09/2017   Diverticulosis 02/14/2015   Irritable bowel syndrome (IBS) 02/14/2015   Hx of iron deficiency anemia 02/14/2015   Hyperlipemia intolerant statins 02/14/2015   Vitamin D deficiency 02/14/2015   Erectile dysfunction 02/14/2015   Ureteral stricture s/p stent 02/14/2015    Allergies  Allergen Reactions   Crestor [Rosuvastatin]     kidney pain    Past Surgical History:  Procedure Laterality Date   URETHRA SURGERY  1990's   stent placement    Social History   Tobacco Use   Smoking status: Never   Smokeless tobacco: Never  Vaping Use   Vaping Use: Never used  Substance Use Topics   Alcohol use: Not Currently    Alcohol/week: 0.0 standard drinks of alcohol   Drug use: No     Medication list has been reviewed and updated.  Current Meds  Medication Sig   aspirin EC 81 MG tablet Take 81 mg by mouth daily.   brimonidine (ALPHAGAN) 0.2 % ophthalmic solution SMARTSIG:In Eye(s)   Cholecalciferol (VITAMIN D3) 5000 units CAPS Take 1 capsule by mouth daily.   ezetimibe (ZETIA) 10 MG tablet TAKE 1 TABLET(10 MG) BY MOUTH DAILY   famotidine (PEPCID) 20 MG tablet Take 1 tablet (20 mg total) by  mouth 2 (two) times daily.   latanoprost (XALATAN) 0.005 % ophthalmic solution Place 1 drop into both eyes at bedtime.   Multiple Vitamin (MULTIVITAMIN) tablet Take 1 tablet by mouth daily.   Omega-3 Fatty Acids (FISH OIL CONCENTRATE PO) Take by mouth.   sildenafil (REVATIO) 20 MG tablet Take 1 tablet daily.   timolol (TIMOPTIC) 0.25 % ophthalmic solution Place 1 drop into both eyes every morning.   vitamin E 180 MG (400 UNITS) capsule Take 400 Units by mouth daily.       05/05/2023    9:11 AM 03/05/2023    9:12 AM 01/13/2023    9:55 AM 07/16/2022    8:58 AM  GAD 7 : Generalized Anxiety Score  Nervous, Anxious, on Edge 0 0 0 0  Control/stop worrying 0 0 0 0   Worry too much - different things 0 0 0 0  Trouble relaxing 0 0 0 0  Restless 0 0 0 0  Easily annoyed or irritable 0 0 0 0  Afraid - awful might happen 0 0 0 0  Total GAD 7 Score 0 0 0 0  Anxiety Difficulty Not difficult at all Not difficult at all Not difficult at all Not difficult at all       05/05/2023    9:11 AM 03/24/2023    8:18 AM 03/05/2023    9:12 AM  Depression screen PHQ 2/9  Decreased Interest 0 0 0  Down, Depressed, Hopeless 0 0 0  PHQ - 2 Score 0 0 0  Altered sleeping 0 0 0  Tired, decreased energy 0 0 0  Change in appetite 0 0 0  Feeling bad or failure about yourself  0 0 0  Trouble concentrating 0 0 0  Moving slowly or fidgety/restless 0 0 0  Suicidal thoughts 0 0 0  PHQ-9 Score 0 0 0  Difficult doing work/chores Not difficult at all      BP Readings from Last 3 Encounters:  05/05/23 118/64  03/05/23 110/68  02/10/23 (!) 150/86    Physical Exam Vitals and nursing note reviewed.  Constitutional:      General: He is not in acute distress.    Appearance: He is well-developed.  HENT:     Head: Normocephalic and atraumatic.  Pulmonary:     Effort: Pulmonary effort is normal. No respiratory distress.  Abdominal:     General: Abdomen is flat. Bowel sounds are decreased.     Palpations: Abdomen is soft.     Tenderness: There is abdominal tenderness in the epigastric area. There is no right CVA tenderness, left CVA tenderness, guarding or rebound. Negative signs include Murphy's sign.  Skin:    General: Skin is warm and dry.     Findings: No rash.  Neurological:     Mental Status: He is alert and oriented to person, place, and time.  Psychiatric:        Mood and Affect: Mood normal.        Behavior: Behavior normal.     Wt Readings from Last 3 Encounters:  05/05/23 186 lb (84.4 kg)  03/24/23 191 lb (86.6 kg)  03/05/23 191 lb (86.6 kg)    BP 118/64   Pulse 61   Temp (!) 97.3 F (36.3 C) (Oral)   Ht 6\' 3"  (1.905 m)   Wt 186 lb (84.4 kg)    SpO2 99%   BMI 23.25 kg/m   Assessment and Plan:  Problem List Items Addressed This Visit   None  Visit Diagnoses     Acute gastritis without hemorrhage, unspecified gastritis type    -  Primary   uncertain etiology - pt appears stable in NAD will treat with Pepcid bid x 2 weeks follow up if worsening or no improvement   Relevant Medications   famotidine (PEPCID) 20 MG tablet   Other Relevant Orders   H. pylori breath test   CBC with Differential/Platelet   Comprehensive metabolic panel       No follow-ups on file.   Partially dictated using Dragon software, any errors are not intentional.  Reubin Milan, MD [facility] [place], [place]

## 2023-05-06 LAB — CBC WITH DIFFERENTIAL/PLATELET
Basophils Absolute: 0 10*3/uL (ref 0.0–0.2)
Basos: 0 %
EOS (ABSOLUTE): 0.1 10*3/uL (ref 0.0–0.4)
Eos: 1 %
Hematocrit: 42.7 % (ref 37.5–51.0)
Hemoglobin: 14.7 g/dL (ref 13.0–17.7)
Immature Grans (Abs): 0.1 10*3/uL (ref 0.0–0.1)
Immature Granulocytes: 1 %
Lymphocytes Absolute: 1.3 10*3/uL (ref 0.7–3.1)
Lymphs: 16 %
MCH: 31.4 pg (ref 26.6–33.0)
MCHC: 34.4 g/dL (ref 31.5–35.7)
MCV: 91 fL (ref 79–97)
Monocytes Absolute: 0.6 10*3/uL (ref 0.1–0.9)
Monocytes: 7 %
Neutrophils Absolute: 6.2 10*3/uL (ref 1.4–7.0)
Neutrophils: 75 %
Platelets: 253 10*3/uL (ref 150–450)
RBC: 4.68 x10E6/uL (ref 4.14–5.80)
RDW: 11.7 % (ref 11.6–15.4)
WBC: 8.3 10*3/uL (ref 3.4–10.8)

## 2023-05-06 LAB — COMPREHENSIVE METABOLIC PANEL
ALT: 88 IU/L — ABNORMAL HIGH (ref 0–44)
AST: 136 IU/L — ABNORMAL HIGH (ref 0–40)
Albumin: 4.4 g/dL (ref 3.8–4.8)
Alkaline Phosphatase: 135 IU/L — ABNORMAL HIGH (ref 44–121)
BUN/Creatinine Ratio: 16 (ref 10–24)
BUN: 18 mg/dL (ref 8–27)
Bilirubin Total: 1.2 mg/dL (ref 0.0–1.2)
CO2: 25 mmol/L (ref 20–29)
Calcium: 9.4 mg/dL (ref 8.6–10.2)
Chloride: 102 mmol/L (ref 96–106)
Creatinine, Ser: 1.12 mg/dL (ref 0.76–1.27)
Globulin, Total: 2.7 g/dL (ref 1.5–4.5)
Glucose: 113 mg/dL — ABNORMAL HIGH (ref 70–99)
Potassium: 4.5 mmol/L (ref 3.5–5.2)
Sodium: 142 mmol/L (ref 134–144)
Total Protein: 7.1 g/dL (ref 6.0–8.5)
eGFR: 70 mL/min/{1.73_m2} (ref >59)

## 2023-05-06 LAB — H. PYLORI BREATH TEST: H pylori Breath Test: NEGATIVE

## 2023-05-07 NOTE — Progress Notes (Signed)
Labs added R78.89.  KP

## 2023-05-10 LAB — ACUTE VIRAL HEPATITIS (HAV, HBV, HCV)
HCV Ab: NONREACTIVE
Hep A IgM: NEGATIVE
Hep B C IgM: NEGATIVE
Hepatitis B Surface Ag: NEGATIVE

## 2023-05-10 LAB — HCV INTERPRETATION

## 2023-05-10 LAB — SPECIMEN STATUS REPORT

## 2023-05-16 ENCOUNTER — Other Ambulatory Visit: Payer: Self-pay | Admitting: Internal Medicine

## 2023-05-16 DIAGNOSIS — K2900 Acute gastritis without bleeding: Secondary | ICD-10-CM

## 2023-06-15 ENCOUNTER — Other Ambulatory Visit: Payer: Self-pay | Admitting: Family Medicine

## 2023-06-15 DIAGNOSIS — K2900 Acute gastritis without bleeding: Secondary | ICD-10-CM

## 2023-07-16 ENCOUNTER — Encounter: Payer: Self-pay | Admitting: Family Medicine

## 2023-07-16 ENCOUNTER — Ambulatory Visit: Payer: Medicare Other

## 2023-07-16 ENCOUNTER — Ambulatory Visit (INDEPENDENT_AMBULATORY_CARE_PROVIDER_SITE_OTHER): Payer: Medicare Other | Admitting: Family Medicine

## 2023-07-16 VITALS — BP 102/62 | HR 57 | Ht 75.0 in | Wt 187.0 lb

## 2023-07-16 DIAGNOSIS — M79622 Pain in left upper arm: Secondary | ICD-10-CM

## 2023-07-16 DIAGNOSIS — K2900 Acute gastritis without bleeding: Secondary | ICD-10-CM

## 2023-07-16 DIAGNOSIS — Z23 Encounter for immunization: Secondary | ICD-10-CM

## 2023-07-16 DIAGNOSIS — E7801 Familial hypercholesterolemia: Secondary | ICD-10-CM

## 2023-07-16 MED ORDER — EZETIMIBE 10 MG PO TABS: ORAL_TABLET | ORAL | Status: DC

## 2023-07-16 NOTE — Progress Notes (Signed)
Date:  07/16/2023   Name:  Patrick Mcknight   DOB:  1951-01-16   MRN:  [id number]   Chief Complaint: Hyperlipidemia and Flu Vaccine  Hyperlipidemia This is a chronic problem. The current episode started more than 1 year ago. The problem is controlled. Recent lipid tests were reviewed and are normal. He has no history of chronic renal disease, diabetes, hypothyroidism, liver disease or obesity. There are no known factors aggravating his hyperlipidemia. Pertinent negatives include no chest pain, focal sensory loss, focal weakness, leg pain, myalgias or shortness of breath. Current antihyperlipidemic treatment includes ezetimibe. The current treatment provides moderate improvement of lipids. There are no compliance problems.  Risk factors for coronary artery disease include dyslipidemia.  Arm Pain  Incident onset: since may. The incident occurred at home. Injury mechanism: awkward [place] cabinet. Pain location: left humerus. The quality of the pain is described as aching. Pertinent negatives include no chest pain, muscle weakness, numbness or tingling. The treatment provided mild relief.    Lab Results  Component Value Date   NA 142 05/05/2023   K 4.5 05/05/2023   CO2 25 05/05/2023   GLUCOSE 113 (H) 05/05/2023   BUN 18 05/05/2023   CREATININE 1.12 05/05/2023   CALCIUM 9.4 05/05/2023   EGFR 70 05/05/2023   GFRNONAA 66 04/23/2020   Lab Results  Component Value Date   CHOL 231 (H) 01/13/2023   HDL 62 01/13/2023   LDLCALC 155 (H) 01/13/2023   TRIG 82 01/13/2023   CHOLHDL 4.3 03/15/2019   Lab Results  Component Value Date   TSH 1.010 04/14/2019   No results found for: "HGBA1C" Lab Results  Component Value Date   WBC 8.3 05/05/2023   HGB 14.7 05/05/2023   HCT 42.7 05/05/2023   MCV 91 05/05/2023   PLT 253 05/05/2023   Lab Results  Component Value Date   ALT 88 (H) 05/05/2023   AST 136 (H) 05/05/2023   ALKPHOS 135 (H) 05/05/2023   BILITOT 1.2 05/05/2023   No results found  for: "25OHVITD2", "25OHVITD3", "VD25OH"   Review of Systems  Respiratory:  Negative for cough, shortness of breath and wheezing.   Cardiovascular:  Negative for chest pain, palpitations and leg swelling.  Gastrointestinal:  Negative for abdominal distention and abdominal pain.  Musculoskeletal:  Negative for myalgias.  Neurological:  Negative for tingling, focal weakness and numbness.    Patient Active Problem List   Diagnosis Date Noted   Primary open angle glaucoma of left eye, mild stage 05/26/2021   Primary open angle glaucoma of right eye, moderate stage 05/26/2021   Aortic atherosclerosis (HCC) 12/15/2018   Glaucoma 03/09/2017   Diverticulosis 02/14/2015   Irritable bowel syndrome (IBS) 02/14/2015   Hx of iron deficiency anemia 02/14/2015   Hyperlipemia intolerant statins 02/14/2015   Vitamin D deficiency 02/14/2015   Erectile dysfunction 02/14/2015   Ureteral stricture s/p stent 02/14/2015    Allergies  Allergen Reactions   Crestor [Rosuvastatin]     kidney pain    Past Surgical History:  Procedure Laterality Date   URETHRA SURGERY  1990's   stent placement    Social History   Tobacco Use   Smoking status: Never   Smokeless tobacco: Never  Vaping Use   Vaping status: Never Used  Substance Use Topics   Alcohol use: Not Currently    Alcohol/week: 0.0 standard drinks of alcohol   Drug use: No     Medication list has been reviewed and updated.  Current Meds  Medication Sig   aspirin EC 81 MG tablet Take 81 mg by mouth daily.   brimonidine (ALPHAGAN) 0.2 % ophthalmic solution SMARTSIG:In Eye(s)   Cholecalciferol (VITAMIN D3) 5000 units CAPS Take 1 capsule by mouth daily.   ezetimibe (ZETIA) 10 MG tablet TAKE 1 TABLET(10 MG) BY MOUTH DAILY   famotidine (PEPCID) 20 MG tablet TAKE 1 TABLET(20 MG) BY MOUTH TWICE DAILY   latanoprost (XALATAN) 0.005 % ophthalmic solution Place 1 drop into both eyes at bedtime.   Multiple Vitamin (MULTIVITAMIN) tablet Take 1  tablet by mouth daily.   Omega-3 Fatty Acids (FISH OIL CONCENTRATE PO) Take by mouth.   sildenafil (REVATIO) 20 MG tablet Take 1 tablet daily.   timolol (TIMOPTIC) 0.25 % ophthalmic solution Place 1 drop into both eyes every morning.   vitamin E 180 MG (400 UNITS) capsule Take 400 Units by mouth daily.       07/16/2023    8:11 AM 05/05/2023    9:11 AM 03/05/2023    9:12 AM 01/13/2023    9:55 AM  GAD 7 : Generalized Anxiety Score  Nervous, Anxious, on Edge 0 0 0 0  Control/stop worrying 0 0 0 0  Worry too much - different things 0 0 0 0  Trouble relaxing 0 0 0 0  Restless 0 0 0 0  Easily annoyed or irritable 0 0 0 0  Afraid - awful might happen 0 0 0 0  Total GAD 7 Score 0 0 0 0  Anxiety Difficulty Not difficult at all Not difficult at all Not difficult at all Not difficult at all       07/16/2023    8:11 AM 05/05/2023    9:11 AM 03/24/2023    8:18 AM  Depression screen PHQ 2/9  Decreased Interest 0 0 0  Down, Depressed, Hopeless 0 0 0  PHQ - 2 Score 0 0 0  Altered sleeping 0 0 0  Tired, decreased energy 0 0 0  Change in appetite 0 0 0  Feeling bad or failure about yourself  0 0 0  Trouble concentrating 0 0 0  Moving slowly or fidgety/restless 0 0 0  Suicidal thoughts 0 0 0  PHQ-9 Score 0 0 0  Difficult doing work/chores Not difficult at all Not difficult at all     BP Readings from Last 3 Encounters:  07/16/23 102/62  05/05/23 118/64  03/05/23 110/68    Physical Exam Vitals and nursing note reviewed.  HENT:     Head: Normocephalic.     Right Ear: External ear normal.     Left Ear: External ear normal.     Nose: Nose normal.  Eyes:     General: No scleral icterus.       Right eye: No discharge.        Left eye: No discharge.     Conjunctiva/sclera: Conjunctivae normal.     Pupils: Pupils are equal, round, and reactive to light.  Neck:     Thyroid: No thyromegaly.     Vascular: No JVD.     Trachea: No tracheal deviation.  Cardiovascular:     Rate and  Rhythm: Normal rate and regular rhythm.     Heart sounds: Normal heart sounds. No murmur heard.    No friction rub. No gallop.  Pulmonary:     Effort: No respiratory distress.     Breath sounds: Normal breath sounds. No wheezing or rales.  Abdominal:     General: Bowel sounds are normal.  Palpations: Abdomen is soft. There is no mass.     Tenderness: There is no abdominal tenderness. There is no guarding or rebound.  Musculoskeletal:        General: Normal range of motion.     Left upper arm: Deformity and tenderness present.     Cervical back: Normal range of motion and neck supple.     Comments: ? Tricep avulsion  Lymphadenopathy:     Cervical: No cervical adenopathy.  Skin:    General: Skin is warm.     Findings: No rash.  Neurological:     Mental Status: He is alert.     Deep Tendon Reflexes:     Reflex Scores:      Patellar reflexes are 2+ on the right side and 2+ on the left side.    Wt Readings from Last 3 Encounters:  07/16/23 187 lb (84.8 kg)  05/05/23 186 lb (84.4 kg)  03/24/23 191 lb (86.6 kg)    BP 102/62   Pulse (!) 57   Ht 6\' 3"  (1.905 m)   Wt 187 lb (84.8 kg)   SpO2 98%   BMI 23.37 kg/m   Assessment and Plan: 1. Familial hypercholesterolemia Chronic.  Controlled.  Stable.  Continue Zetia 10 mg once a day.  Will check lipid panel for current level of control.  Will recheck patient in 6 months. - ezetimibe (ZETIA) 10 MG tablet; TAKE 1 TABLET(10 MG) BY MOUTH DAILY - Lipid Panel With LDL/HDL Ratio  2. Acute gastritis without hemorrhage, unspecified gastritis type Currently stable not necessary to use famotidine.  3. Pain of left upper arm New onset but persistent problematic injury from May earlier this year.  Awkwardly caught a cabinet which caused pain in the left triceps area pain is noted with extension there is a possible palpable deformity and suggesting an avulsion we will refer to sports medicine for evaluation - DG Humerus Left; Future  4.  Flu vaccine need Discussed and administered - Flu Vaccine Trivalent High Dose (Fluad)     Elizabeth Sauer, MD

## 2023-07-17 LAB — LIPID PANEL WITH LDL/HDL RATIO
Cholesterol, Total: 238 mg/dL — ABNORMAL HIGH (ref 100–199)
HDL: 68 mg/dL (ref >39)
LDL Chol Calc (NIH): 158 mg/dL — ABNORMAL HIGH (ref 0–99)
LDL/HDL Ratio: 2.3 ratio (ref 0.0–3.6)
Triglycerides: 73 mg/dL (ref 0–149)
VLDL Cholesterol Cal: 12 mg/dL (ref 5–40)

## 2023-07-23 DIAGNOSIS — H401221 Low-tension glaucoma, left eye, mild stage: Secondary | ICD-10-CM | POA: Diagnosis not present

## 2023-07-23 DIAGNOSIS — H2513 Age-related nuclear cataract, bilateral: Secondary | ICD-10-CM | POA: Diagnosis not present

## 2023-07-23 DIAGNOSIS — H5203 Hypermetropia, bilateral: Secondary | ICD-10-CM | POA: Diagnosis not present

## 2023-07-23 DIAGNOSIS — H401212 Low-tension glaucoma, right eye, moderate stage: Secondary | ICD-10-CM | POA: Diagnosis not present

## 2023-08-03 ENCOUNTER — Ambulatory Visit (INDEPENDENT_AMBULATORY_CARE_PROVIDER_SITE_OTHER): Payer: Medicare Other

## 2023-08-03 DIAGNOSIS — Z23 Encounter for immunization: Secondary | ICD-10-CM | POA: Diagnosis not present

## 2023-08-09 DIAGNOSIS — K080 Exfoliation of teeth due to systemic causes: Secondary | ICD-10-CM | POA: Diagnosis not present

## 2023-11-04 DIAGNOSIS — H401221 Low-tension glaucoma, left eye, mild stage: Secondary | ICD-10-CM | POA: Diagnosis not present

## 2023-11-04 DIAGNOSIS — H401212 Low-tension glaucoma, right eye, moderate stage: Secondary | ICD-10-CM | POA: Diagnosis not present

## 2023-11-04 DIAGNOSIS — H2513 Age-related nuclear cataract, bilateral: Secondary | ICD-10-CM | POA: Diagnosis not present

## 2023-11-04 DIAGNOSIS — H5203 Hypermetropia, bilateral: Secondary | ICD-10-CM | POA: Diagnosis not present

## 2023-12-28 ENCOUNTER — Ambulatory Visit: Payer: Self-pay | Admitting: Family Medicine

## 2024-01-10 ENCOUNTER — Other Ambulatory Visit: Payer: Self-pay | Admitting: Family Medicine

## 2024-01-10 DIAGNOSIS — E7801 Familial hypercholesterolemia: Secondary | ICD-10-CM

## 2024-01-10 NOTE — Telephone Encounter (Signed)
 Copied from CRM [id number]. Topic: Clinical - Medication Refill >> Jan 10, 2024  9:33 AM Fuller Mandril wrote: Most Recent Primary Care Visit:  Provider: Jerl Santos  Department: [facility] [place]  Visit Type: NURSE VISIT  Date: 08/03/2023  Medication: ezetimibe (ZETIA) 10 MG tablet - 90 day supply  Has the patient contacted their pharmacy? Yes (Agent: If no, request that the patient contact the pharmacy for the refill. If patient does not wish to contact the pharmacy document the reason why and proceed with request.) (Agent: If yes, when and what did the pharmacy advise?) requested twice no response   Is this the correct pharmacy for this prescription? Yes If no, delete pharmacy and type the correct one.  This is the patient's preferred pharmacy:  [id number] - [place], [place] - [address] AT [facility] OF [address] [place] [address] Phone: [phone] Fax: [phone]   Has the prescription been filled recently? No  Is the patient out of the medication? Yes  Has the patient been seen for an appointment in the last year OR does the patient have an upcoming appointment? Yes  Can we respond through MyChart? No  Agent: Please be advised that Rx refills may take up to 3 business days. We ask that you follow-up with your pharmacy.

## 2024-01-11 MED ORDER — EZETIMIBE 10 MG PO TABS: ORAL_TABLET | ORAL | 0 refills | Status: DC

## 2024-01-11 NOTE — Telephone Encounter (Signed)
 Requested Prescriptions  Pending Prescriptions Disp Refills   ezetimibe (ZETIA) 10 MG tablet 90 tablet 0    Sig: TAKE 1 TABLET(10 MG) BY MOUTH DAILYTAKE 1 TABLET(10 MG) BY MOUTH DAILY     Cardiovascular:  Antilipid - Sterol Transport Inhibitors Failed - 01/11/2024  2:38 PM      Failed - AST in normal range and within 360 days    AST  Date Value Ref Range Status  05/05/2023 136 (H) 0 - 40 IU/L Final         Failed - ALT in normal range and within 360 days    ALT  Date Value Ref Range Status  05/05/2023 88 (H) 0 - 44 IU/L Final         Failed - Lipid Panel in normal range within the last 12 months    Cholesterol, Total  Date Value Ref Range Status  07/16/2023 238 (H) 100 - 199 mg/dL Final   LDL Chol Calc (NIH)  Date Value Ref Range Status  07/16/2023 158 (H) 0 - 99 mg/dL Final   HDL  Date Value Ref Range Status  07/16/2023 68 >39 mg/dL Final   Triglycerides  Date Value Ref Range Status  07/16/2023 73 0 - 149 mg/dL Final         Passed - Patient is not pregnant      Passed - Valid encounter within last 12 months    Recent Outpatient Visits           5 months ago Familial hypercholesterolemia   [facility]   8 months ago Acute gastritis without hemorrhage, unspecified gastritis type   [facility] at [facility], Nyoka Cowden, MD   10 months ago Onychomycosis   [facility], MD   12 months ago Familial hypercholesterolemia   [facility], MD   1 year ago Familial hypercholesterolemia   [facility] at [facility], MD       Future Appointments             In 1 month Stoioff, Verna Czech, MD [facility] [place]

## 2024-01-28 DIAGNOSIS — H401212 Low-tension glaucoma, right eye, moderate stage: Secondary | ICD-10-CM | POA: Diagnosis not present

## 2024-01-28 DIAGNOSIS — H2513 Age-related nuclear cataract, bilateral: Secondary | ICD-10-CM | POA: Diagnosis not present

## 2024-01-28 DIAGNOSIS — H5203 Hypermetropia, bilateral: Secondary | ICD-10-CM | POA: Diagnosis not present

## 2024-01-28 DIAGNOSIS — H401221 Low-tension glaucoma, left eye, mild stage: Secondary | ICD-10-CM | POA: Diagnosis not present

## 2024-02-08 ENCOUNTER — Other Ambulatory Visit: Payer: Self-pay | Admitting: Urology

## 2024-02-10 ENCOUNTER — Ambulatory Visit: Payer: Self-pay | Admitting: Urology

## 2024-02-10 ENCOUNTER — Other Ambulatory Visit: Payer: Self-pay | Admitting: Urology

## 2024-03-29 ENCOUNTER — Ambulatory Visit: Payer: Medicare Other | Admitting: Emergency Medicine

## 2024-03-29 VITALS — Ht 75.0 in | Wt 188.0 lb

## 2024-03-29 DIAGNOSIS — Z0000 Encounter for general adult medical examination without abnormal findings: Secondary | ICD-10-CM

## 2024-03-29 NOTE — Progress Notes (Signed)
 Subjective:   Patrick Mcknight is a 73 y.o. who presents for a Medicare Wellness preventive visit.  As a reminder, Annual Wellness Visits don't include a physical exam, and some assessments may be limited, especially if this visit is performed virtually. We may recommend an in-person follow-up visit with your provider if needed.  Visit Complete: Virtual I connected with  Manford See on 03/29/24 by a audio enabled telemedicine application and verified that I am speaking with the correct person using two identifiers.  Patient Location: Home  Provider Location: Home Office  I discussed the limitations of evaluation and management by telemedicine. The patient expressed understanding and agreed to proceed.  Vital Signs: Because this visit was a virtual/telehealth visit, some criteria may be missing or patient reported. Any vitals not documented were not able to be obtained and vitals that have been documented are patient reported.  VideoDeclined- This patient declined Librarian, academic. Therefore the visit was completed with audio only.  Persons Participating in Visit: Patient.  AWV Questionnaire: No: Patient Medicare AWV questionnaire was not completed prior to this visit.  Cardiac Risk Factors include: advanced age (>35men, >41 women);male gender;dyslipidemia     Objective:     Today's Vitals   03/29/24 0808  Weight: 188 lb (85.3 kg)  Height: 6\' 3"  (1.905 m)   Body mass index is 23.5 kg/m.     03/29/2024    8:24 AM 03/24/2023    8:19 AM 03/16/2022    8:53 AM 03/12/2021    8:58 AM 03/11/2020    8:36 AM  Advanced Directives  Does Patient Have a Medical Advance Directive? No;Yes No No No No  Type of Diplomatic Services operational officer;Living will      Does patient want to make changes to medical advance directive? No - Patient declined   No - Patient declined   Copy of Healthcare Power of Attorney in Chart? No - copy requested       Would patient like information on creating a medical advance directive?  No - Patient declined Yes (MAU/Ambulatory/Procedural Areas - Information given)  Yes (MAU/Ambulatory/Procedural Areas - Information given)    Current Medications (verified) Outpatient Encounter Medications as of 03/29/2024  Medication Sig   aspirin EC 81 MG tablet Take 81 mg by mouth daily.   brimonidine (ALPHAGAN) 0.2 % ophthalmic solution SMARTSIG:In Eye(s)   Cholecalciferol (VITAMIN D3) 5000 units CAPS Take 1 capsule by mouth daily.   ezetimibe  (ZETIA ) 10 MG tablet TAKE 1 TABLET(10 MG) BY MOUTH DAILYTAKE 1 TABLET(10 MG) BY MOUTH DAILY   latanoprost (XALATAN) 0.005 % ophthalmic solution Place 1 drop into both eyes at bedtime.   Multiple Vitamin (MULTIVITAMIN) tablet Take 1 tablet by mouth daily.   sildenafil  (REVATIO ) 20 MG tablet TAKE 1 TABLET BY MOUTH DAILY   timolol (TIMOPTIC) 0.25 % ophthalmic solution Place 1 drop into both eyes every morning.   vitamin E 180 MG (400 UNITS) capsule Take 400 Units by mouth daily.   famotidine  (PEPCID ) 20 MG tablet TAKE 1 TABLET(20 MG) BY MOUTH TWICE DAILY (Patient not taking: Reported on 03/29/2024)   Omega-3 Fatty Acids (FISH OIL CONCENTRATE PO) Take by mouth. (Patient not taking: Reported on 03/29/2024)   No facility-administered encounter medications on file as of 03/29/2024.    Allergies (verified) Crestor [rosuvastatin]   History: Past Medical History:  Diagnosis Date   Erectile dysfunction    GERD (gastroesophageal reflux disease)    Past Surgical History:  Procedure  Laterality Date   URETHRA SURGERY  1990's   stent placement   Family History  Problem Relation Age of Onset   Other Mother 52       "old age"   Emphysema Father    Social History   Socioeconomic History   Marital status: Widowed    Spouse name: Not on file   Number of children: 2   Years of education: Not on file   Highest education level: Not on file  Occupational History   Occupation:  retired  Tobacco Use   Smoking status: Never    Passive exposure: Past   Smokeless tobacco: Never  Vaping Use   Vaping status: Never Used  Substance and Sexual Activity   Alcohol use: Not Currently    Alcohol/week: 0.0 standard drinks of alcohol   Drug use: No   Sexual activity: Yes  Other Topics Concern   Not on file  Social History Narrative   2 adopted children.    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, minerals   Social Drivers of [employer]: Low Risk  (03/29/2024)   Overall Financial Resource Strain (CARDIA)    Difficulty of Paying Living Expenses: Not hard at all  Food Insecurity: No Food Insecurity (03/29/2024)   Hunger Vital Sign    Worried About Running Out of Food in the Last Year: Never true    Ran Out of Food in the Last Year: Never true  Transportation Needs: No Transportation Needs (03/29/2024)   PRAPARE - Administrator, Civil Service (Medical): No    Lack of Transportation (Non-Medical): No  Physical Activity: Sufficiently Active (03/29/2024)   Exercise Vital Sign    Days of Exercise per Week: 7 days    Minutes of Exercise per Session: 60 min  Stress: No Stress Concern Present (03/29/2024)   [employer] of Occupational Health - Occupational Stress Questionnaire    Feeling of Stress : Not at all  Social Connections: Moderately Isolated (03/29/2024)   Social Connection and Isolation Panel [NHANES]    Frequency of Communication with Friends and Family: More than three times a week    Frequency of Social Gatherings with Friends and Family: More than three times a week    Attends Religious Services: 1 to 4 times per year    Active Member of [employer] or Organizations: No    Attends Banker Meetings: Never    Marital Status: Widowed    Tobacco Counseling Counseling given: No    Clinical Intake:  Pre-visit preparation completed: Yes  Pain : No/denies pain     BMI - recorded: 23.5 Nutritional  Status: BMI of 19-24  Normal Nutritional Risks: None Diabetes: No  No results found for: "HGBA1C"   How often do you need to have someone help you when you read instructions, pamphlets, or other written materials from your doctor or pharmacy?: 1 - Never  Interpreter Needed?: No  Information entered by :: Jaunita Messier, CMA   Activities of Daily Living     03/29/2024    8:10 AM  In your present state of health, do you have any difficulty performing the following activities:  Hearing? 0  Vision? 0  Difficulty concentrating or making decisions? 0  Walking or climbing stairs? 0  Dressing or bathing? 0  Doing errands, shopping? 0  Preparing Food and eating ? N  Using the Toilet? N  In the past six months, have you accidently leaked urine?  N  Do you have problems with loss of bowel control? N  Managing your Medications? N  Managing your Finances? N  Housekeeping or managing your Housekeeping? N    Patient Care Team: Kotturi, Vinay K, MD as PCP - General (Family Medicine) Rea Cambridge, MD as Consulting Physician (Urology) Reche Canales, OD (Optometry)  I have updated your Care Teams any recent Medical Services you may have received from other providers in the past year.     Assessment:    This is a routine wellness examination for Sammie Crigler.  Hearing/Vision screen Hearing Screening - Comments:: Denies hearing loss Vision Screening - Comments:: Gets routine eye exams, Dr. Barrie Borer, [place]   Goals Addressed             This Visit's Progress    Patient Stated       Continue active lifestyle       Depression Screen     03/29/2024    8:22 AM 07/16/2023    8:11 AM 05/05/2023    9:11 AM 03/24/2023    8:18 AM 03/05/2023    9:12 AM 01/13/2023    9:55 AM 07/16/2022    8:57 AM  PHQ 2/9 Scores  PHQ - 2 Score 0 0 0 0 0 0 0  PHQ- 9 Score 0 0 0 0 0 0 0    Fall Risk     03/29/2024    8:25 AM 07/16/2023    8:11 AM 05/05/2023    9:11 AM 03/24/2023    8:20 AM  03/05/2023    9:12 AM  Fall Risk   Falls in the past year? 0 0 0 0 0  Number falls in past yr: 0 0 0 0 0  Injury with Fall? 0 0 0 0 0  Risk for fall due to : No Fall Risks No Fall Risks No Fall Risks No Fall Risks No Fall Risks  Follow up Falls evaluation completed Falls evaluation completed Falls evaluation completed Falls prevention discussed;Falls evaluation completed Falls evaluation completed    MEDICARE RISK AT HOME:  Medicare Risk at Home Any stairs in or around the home?: Yes (2 steps) If so, are there any without handrails?: No Home free of loose throw rugs in walkways, pet beds, electrical cords, etc?: Yes Adequate lighting in your home to reduce risk of falls?: Yes Life alert?: No Use of a cane, walker or w/c?: No Grab bars in the bathroom?: Yes Shower chair or bench in shower?: Yes Elevated toilet seat or a handicapped toilet?: Yes  TIMED UP AND GO:  Was the test performed?  No  Cognitive Function: 6CIT completed        03/29/2024    8:27 AM 03/24/2023    8:25 AM  6CIT Screen  What Year? 0 points 0 points  What month? 0 points 0 points  What time? 0 points 0 points  Count back from 20 2 points 2 points  Months in reverse 0 points 0 points  Repeat phrase 2 points 2 points  Total Score 4 points 4 points    Immunizations Immunization History  Administered Date(s) Administered   Fluad Quad(high Dose 65+) 08/01/2019, 07/16/2022   Fluad Trivalent(High Dose 65+) 07/16/2023   Influenza, High Dose Seasonal PF 08/22/2020   PFIZER(Purple Top)SARS-COV-2 Vaccination 12/08/2019, 12/29/2019, 08/22/2020, 02/10/2021   Pfizer(Comirnaty)Fall Seasonal Vaccine 12 years and older 08/03/2023   Pneumococcal Conjugate-13 03/10/2016   Pneumococcal Polysaccharide-23 03/09/2017   Zoster Recombinant(Shingrix) 01/14/2022, 05/06/2022    Screening  Tests Health Maintenance  Topic Date Due   COVID-19 Vaccine (6 - Pfizer risk 2024-25 season) 02/01/2024   INFLUENZA VACCINE  05/26/2024    Medicare Annual Wellness (AWV)  03/29/2025   Colonoscopy  06/06/2030   Pneumonia Vaccine 27+ Years old  Completed   Hepatitis C Screening  Completed   Zoster Vaccines- Shingrix  Completed   HPV VACCINES  Aged Out   Meningococcal B Vaccine  Aged Out   DTaP/Tdap/Td  Discontinued    Health Maintenance  Health Maintenance Due  Topic Date Due   COVID-19 Vaccine (6 - Pfizer risk 2024-25 season) 02/01/2024   Health Maintenance Items Addressed: See Nurse Notes at the end of this note  Additional Screening:  Vision Screening: Recommended annual ophthalmology exams for early detection of glaucoma and other disorders of the eye. Would you like a referral to an eye doctor? No    Dental Screening: Recommended annual dental exams for proper oral hygiene  Community Resource Referral / Chronic Care Management: CRR required this visit?  No   CCM required this visit?  No   Plan:    I have personally reviewed and noted the following in the patient's chart:   Medical and social history Use of alcohol, tobacco or illicit drugs  Current medications and supplements including opioid prescriptions. Patient is not currently taking opioid prescriptions. Functional ability and status Nutritional status Physical activity Advanced directives List of other physicians Hospitalizations, surgeries, and ER visits in previous 12 months Vitals Screenings to include cognitive, depression, and falls Referrals and appointments  In addition, I have reviewed and discussed with patient certain preventive protocols, quality metrics, and best practice recommendations. A written personalized care plan for preventive services as well as general preventive health recommendations were provided to patient.   Jaunita Messier, CMA   03/29/2024   After Visit Summary: (Declined) Due to this being a telephonic visit, with patients personalized plan was offered to patient but patient Declined AVS at this time    Notes: Please refer to Routing Comments.

## 2024-03-29 NOTE — Patient Instructions (Signed)
 Patrick Mcknight , Thank you for taking time out of your busy schedule to complete your Annual Wellness Visit with me. I enjoyed our conversation and look forward to speaking with you again next year. I, as well as your care team,  appreciate your ongoing commitment to your health goals. Please review the following plan we discussed and let me know if I can assist you in the future. Your Game plan/ To Do List    Referrals: None  Follow up Visits: Next Medicare AWV with our clinical staff: 04/04/25 @ 8:10 am (phone visit)   Have you seen your provider in the last 6 months (3 months if uncontrolled diabetes)? No Next Office Visit with your provider: 04/04/24 @ 8:00 am with Dr. Altamese Jest  Clinician Recommendations: Keep up the good work!!   Aim for 30 minutes of exercise or brisk walking, 6-8 glasses of water, and 5 servings of fruits and vegetables each day.       This is a list of the screening recommended for you and due dates:  Health Maintenance  Topic Date Due   COVID-19 Vaccine (6 - Pfizer risk 2024-25 season) 02/01/2024   Flu Shot  05/26/2024   Medicare Annual Wellness Visit  03/29/2025   Colon Cancer Screening  06/06/2030   Pneumonia Vaccine  Completed   Hepatitis C Screening  Completed   Zoster (Shingles) Vaccine  Completed   HPV Vaccine  Aged Out   Meningitis B Vaccine  Aged Out   DTaP/Tdap/Td vaccine  Discontinued    Advanced directives: (Copy Requested) Please bring a copy of your health care power of attorney and living will to the office to be added to your chart at your convenience. You can mail to [facility] [address] [place], [place] [address] or email to [email or web address] Advance Care Planning is important because it:  [x]  Makes sure you receive the medical care that is consistent with your values, goals, and preferences  [x]  It provides guidance to your family and loved ones and reduces their decisional burden about whether or not they  are making the right decisions based on your wishes.  Follow the link provided in your after visit summary or read over the paperwork we have mailed to you to help you started getting your Advance Directives in place. If you need assistance in completing these, please reach out to us  so that we can help you!  See attachments for Preventive Care and Fall Prevention Tips.   Fall Prevention in the Home, Adult Falls can cause injuries and affect people of all ages. There are many simple things that you can do to make your home safe and to help prevent falls. If you need it, ask for help making these changes. What actions can I take to prevent falls? General information Use good lighting in all rooms. Make sure to: Replace any light bulbs that burn out. Turn on lights if it is dark and use night-lights. Keep items that you use often in easy-to-reach places. Lower the shelves around your home if needed. Move furniture so that there are clear paths around it. Do not keep throw rugs or other things on the floor that can make you trip. If any of your floors are uneven, fix them. Add color or contrast paint or tape to clearly mark and help you see: Grab bars or handrails. First and last steps of staircases. Where the edge of each step is. If you use a ladder or  stepladder: Make sure that it is fully opened. Do not climb a closed ladder. Make sure the sides of the ladder are locked in place. Have someone hold the ladder while you use it. Know where your pets are as you move through your home. What can I do in the bathroom?     Keep the floor dry. Clean up any water that is on the floor right away. Remove soap buildup in the bathtub or shower. Buildup makes bathtubs and showers slippery. Use non-skid mats or decals on the floor of the bathtub or shower. Attach bath mats securely with double-sided, non-slip rug tape. If you need to sit down while you are in the shower, use a non-slip  stool. Install grab bars by the toilet and in the bathtub and shower. Do not use towel bars as grab bars. What can I do in the bedroom? Make sure that you have a light by your bed that is easy to reach. Do not use any sheets or blankets on your bed that hang to the floor. Have a firm bench or chair with side arms that you can use for support when you get dressed. What can I do in the kitchen? Clean up any spills right away. If you need to reach something above you, use a sturdy step stool that has a grab bar. Keep electrical cables out of the way. Do not use floor polish or wax that makes floors slippery. What can I do with my stairs? Do not leave anything on the stairs. Make sure that you have a light switch at the top and the bottom of the stairs. Have them installed if you do not have them. Make sure that there are handrails on both sides of the stairs. Fix handrails that are broken or loose. Make sure that handrails are as long as the staircases. Install non-slip stair treads on all stairs in your home if they do not have carpet. Avoid having throw rugs at the top or bottom of stairs, or secure the rugs with carpet tape to prevent them from moving. Choose a carpet design that does not hide the edge of steps on the stairs. Make sure that carpet is firmly attached to the stairs. Fix any carpet that is loose or worn. What can I do on the outside of my home? Use bright outdoor lighting. Repair the edges of walkways and driveways and fix any cracks. Clear paths of anything that can make you trip, such as tools or rocks. Add color or contrast paint or tape to clearly mark and help you see high doorway thresholds. Trim any bushes or trees on the main path into your home. Check that handrails are securely fastened and in good repair. Both sides of all steps should have handrails. Install guardrails along the edges of any raised decks or porches. Have leaves, snow, and ice cleared regularly. Use  sand, salt, or ice melt on walkways during winter months if you live where there is ice and snow. In the garage, clean up any spills right away, including grease or oil spills. What other actions can I take? Review your medicines with your health care provider. Some medicines can make you confused or feel dizzy. This can increase your chance of falling. Wear closed-toe shoes that fit well and support your feet. Wear shoes that have rubber soles and low heels. Use a cane, walker, scooter, or crutches that help you move around if needed. Talk with your provider about other ways that  you can decrease your risk of falls. This may include seeing a physical therapist to learn to do exercises to improve movement and strength. Where to find more information Centers for Disease Control and Prevention, STEADI: TonerPromos.no [employer] on Aging: BaseRingTones.pl National Institute on Aging: BaseRingTones.pl Contact a health care provider if: You are afraid of falling at home. You feel weak, drowsy, or dizzy at home. You fall at home. Get help right away if you: Lose consciousness or have trouble moving after a fall. Have a fall that causes a head injury. These symptoms may be an emergency. Get help right away. Call 911. Do not wait to see if the symptoms will go away. Do not drive yourself to the hospital. This information is not intended to replace advice given to you by your health care provider. Make sure you discuss any questions you have with your health care provider. Document Revised: 06/15/2022 Document Reviewed: 06/15/2022 Elsevier Patient Education  2024 [employer].

## 2024-04-04 ENCOUNTER — Ambulatory Visit (INDEPENDENT_AMBULATORY_CARE_PROVIDER_SITE_OTHER): Admitting: Family Medicine

## 2024-04-04 ENCOUNTER — Encounter: Payer: Self-pay | Admitting: Family Medicine

## 2024-04-04 VITALS — BP 126/80 | HR 50 | Ht 75.0 in | Wt 186.0 lb

## 2024-04-04 DIAGNOSIS — N400 Enlarged prostate without lower urinary tract symptoms: Secondary | ICD-10-CM

## 2024-04-04 DIAGNOSIS — Z125 Encounter for screening for malignant neoplasm of prostate: Secondary | ICD-10-CM | POA: Diagnosis not present

## 2024-04-04 DIAGNOSIS — R748 Abnormal levels of other serum enzymes: Secondary | ICD-10-CM | POA: Diagnosis not present

## 2024-04-04 DIAGNOSIS — Z760 Encounter for issue of repeat prescription: Secondary | ICD-10-CM

## 2024-04-04 DIAGNOSIS — N529 Male erectile dysfunction, unspecified: Secondary | ICD-10-CM

## 2024-04-04 DIAGNOSIS — Z79899 Other long term (current) drug therapy: Secondary | ICD-10-CM

## 2024-04-04 DIAGNOSIS — E7801 Familial hypercholesterolemia: Secondary | ICD-10-CM

## 2024-04-04 MED ORDER — SILDENAFIL CITRATE 20 MG PO TABS: 20.0000 mg | ORAL_TABLET | Freq: Every day | ORAL | 1 refills | Status: AC

## 2024-04-04 MED ORDER — EZETIMIBE 10 MG PO TABS: ORAL_TABLET | ORAL | 1 refills | Status: DC

## 2024-04-04 NOTE — Progress Notes (Signed)
 New Patient Office Visit  Subjective    Patient ID: Patrick Mcknight, male    DOB: February 04, 1951  Age: 73 y.o. MRN: [id number]  CC:  Chief Complaint  Patient presents with   New Patient (Initial Visit)    Transfer of Care from Dr Rochelle Chu to Dr Norma Beckers.   Hyperlipidemia   Erectile Dysfunction     Assessment & Plan:   Familial hypercholesterolemia -     Ezetimibe ; TAKE 1 TABLET(10 MG) BY MOUTH DAILYTAKE 1 TABLET(10 MG) BY MOUTH DAILY  Dispense: 90 tablet; Refill: 1 -     Lipid panel  Benign prostatic hyperplasia without lower urinary tract symptoms  Erectile dysfunction, unspecified erectile dysfunction type -     Sildenafil  Citrate; Take 1 tablet (20 mg total) by mouth daily.  Dispense: 30 tablet; Refill: 1  Medication refill -     Sildenafil  Citrate; Take 1 tablet (20 mg total) by mouth daily.  Dispense: 30 tablet; Refill: 1 -     Ezetimibe ; TAKE 1 TABLET(10 MG) BY MOUTH DAILYTAKE 1 TABLET(10 MG) BY MOUTH DAILY  Dispense: 90 tablet; Refill: 1  Encounter for long-term current use of medication -     Comprehensive metabolic panel with GFR -     CBC with Differential/Platelet  Abnormal liver enzymes -     Comprehensive metabolic panel with GFR  Screening for prostate cancer -     PSA   Routine labs ordered. Meds refilled.  Continue current medications.  Return in about 6 months (around 10/04/2024) for chronic follow up with PCP.   Patrick K Brandye Inthavong, Patrick Mcknight   Concerns: Med refills.   He has no health concerns.    HLD:  Not on statin due to hx of back ache after taking Crestor. Currently on Zetia . Requesting refill. Denies side effects.   The 10-year ASCVD risk score (Arnett DK, et al., 2019) is: 12.2%   ED:  Follows urology. Requesting refill. Denies side effects.    BPH:  Reports he is doing okay. Denies nocturia.    Hx of abnormal liver enzymes:  States it was likely due to food poisoning.       Patrick Mcknight presents to establish care with me as his  new provider. He was seeing Dr.Jones as his primary.   Outpatient Encounter Medications as of 04/04/2024  Medication Sig   aspirin EC 81 MG tablet Take 81 mg by mouth daily.   brimonidine (ALPHAGAN) 0.2 % ophthalmic solution SMARTSIG:In Eye(s)   Cholecalciferol (VITAMIN D3) 5000 units CAPS Take 1 capsule by mouth daily.   famotidine  (PEPCID ) 20 MG tablet TAKE 1 TABLET(20 MG) BY MOUTH TWICE DAILY   latanoprost (XALATAN) 0.005 % ophthalmic solution Place 1 drop into both eyes at bedtime.   Multiple Vitamin (MULTIVITAMIN) tablet Take 1 tablet by mouth daily.   Omega-3 Fatty Acids (FISH OIL CONCENTRATE PO) Take by mouth.   timolol (TIMOPTIC) 0.25 % ophthalmic solution Place 1 drop into both eyes every morning.   vitamin E 180 MG (400 UNITS) capsule Take 400 Units by mouth daily.   [DISCONTINUED] ezetimibe  (ZETIA ) 10 MG tablet TAKE 1 TABLET(10 MG) BY MOUTH DAILYTAKE 1 TABLET(10 MG) BY MOUTH DAILY   [DISCONTINUED] sildenafil  (REVATIO ) 20 MG tablet TAKE 1 TABLET BY MOUTH DAILY   ezetimibe  (ZETIA ) 10 MG tablet TAKE 1 TABLET(10 MG) BY MOUTH DAILYTAKE 1 TABLET(10 MG) BY MOUTH DAILY   sildenafil  (REVATIO ) 20 MG tablet Take 1 tablet (20 mg total) by mouth daily.   No  facility-administered encounter medications on file as of 04/04/2024.      Review of Systems  All other systems reviewed and are negative.       Objective    BP 126/80   Pulse (!) 50   Ht 6\' 3"  (1.905 m)   Wt 186 lb (84.4 kg)   SpO2 99%   BMI 23.25 kg/m   Physical Exam Vitals and nursing note reviewed.  Constitutional:      Appearance: Normal appearance.  HENT:     Head: Normocephalic.     Right Ear: External ear normal.     Left Ear: External ear normal.  Eyes:     Conjunctiva/sclera: Conjunctivae normal.  Cardiovascular:     Rate and Rhythm: Normal rate.  Pulmonary:     Effort: Pulmonary effort is normal. No respiratory distress.  Abdominal:     Palpations: Abdomen is soft.  Musculoskeletal:        General:  Normal range of motion.  Skin:    General: Skin is warm.  Neurological:     Mental Status: He is alert and oriented to person, place, and time.  Psychiatric:        Mood and Affect: Mood normal.

## 2024-04-05 ENCOUNTER — Ambulatory Visit: Payer: Self-pay | Admitting: Family Medicine

## 2024-04-05 LAB — CBC WITH DIFFERENTIAL/PLATELET
Basophils Absolute: 0 10*3/uL (ref 0.0–0.2)
Basos: 1 %
EOS (ABSOLUTE): 0.2 10*3/uL (ref 0.0–0.4)
Eos: 4 %
Hematocrit: 44.7 % (ref 37.5–51.0)
Hemoglobin: 15.2 g/dL (ref 13.0–17.7)
Immature Grans (Abs): 0 10*3/uL (ref 0.0–0.1)
Immature Granulocytes: 0 %
Lymphocytes Absolute: 2.2 10*3/uL (ref 0.7–3.1)
Lymphs: 39 %
MCH: 32.1 pg (ref 26.6–33.0)
MCHC: 34 g/dL (ref 31.5–35.7)
MCV: 95 fL (ref 79–97)
Monocytes Absolute: 0.5 10*3/uL (ref 0.1–0.9)
Monocytes: 10 %
Neutrophils Absolute: 2.7 10*3/uL (ref 1.4–7.0)
Neutrophils: 46 %
Platelets: 291 10*3/uL (ref 150–450)
RBC: 4.73 x10E6/uL (ref 4.14–5.80)
RDW: 12.5 % (ref 11.6–15.4)
WBC: 5.7 10*3/uL (ref 3.4–10.8)

## 2024-04-05 LAB — COMPREHENSIVE METABOLIC PANEL WITH GFR
ALT: 20 IU/L (ref 0–44)
AST: 26 IU/L (ref 0–40)
Albumin: 4.3 g/dL (ref 3.8–4.8)
Alkaline Phosphatase: 58 IU/L (ref 44–121)
BUN/Creatinine Ratio: 15 (ref 10–24)
BUN: 15 mg/dL (ref 8–27)
Bilirubin Total: 0.6 mg/dL (ref 0.0–1.2)
CO2: 23 mmol/L (ref 20–29)
Calcium: 9.4 mg/dL (ref 8.6–10.2)
Chloride: 103 mmol/L (ref 96–106)
Creatinine, Ser: 1.03 mg/dL (ref 0.76–1.27)
Globulin, Total: 2.7 g/dL (ref 1.5–4.5)
Glucose: 92 mg/dL (ref 70–99)
Potassium: 4.7 mmol/L (ref 3.5–5.2)
Sodium: 140 mmol/L (ref 134–144)
Total Protein: 7 g/dL (ref 6.0–8.5)
eGFR: 77 mL/min/{1.73_m2} (ref >59)

## 2024-04-05 LAB — LIPID PANEL
Chol/HDL Ratio: 4 ratio (ref 0.0–5.0)
Cholesterol, Total: 250 mg/dL — ABNORMAL HIGH (ref 100–199)
HDL: 63 mg/dL (ref >39)
LDL Chol Calc (NIH): 171 mg/dL — ABNORMAL HIGH (ref 0–99)
Triglycerides: 92 mg/dL (ref 0–149)
VLDL Cholesterol Cal: 16 mg/dL (ref 5–40)

## 2024-04-05 LAB — PSA: Prostate Specific Ag, Serum: 1.6 ng/mL (ref 0.0–4.0)

## 2024-04-05 NOTE — Progress Notes (Signed)
 Your bad cholesterol is elevated.  Recommend healthy diet and exercise and also taking medications to lower cholesterol will lower your risk for heart attack. Please let me know if you want me to call in the medication. Since you said Crestor caused you back ache we can try Lipitor. The most common side effects may include headache, nausea, muscle aches and pains, weakness, and constipation

## 2024-04-06 ENCOUNTER — Other Ambulatory Visit: Payer: Self-pay | Admitting: Family Medicine

## 2024-04-06 DIAGNOSIS — E7801 Familial hypercholesterolemia: Secondary | ICD-10-CM

## 2024-04-06 MED ORDER — ATORVASTATIN CALCIUM 10 MG PO TABS: 10.0000 mg | ORAL_TABLET | Freq: Every day | ORAL | 1 refills | Status: DC

## 2024-04-06 NOTE — Progress Notes (Signed)
 Med refill

## 2024-04-06 NOTE — Progress Notes (Signed)
 Spoke with patient, discussed providers instructions. He verbalized understanding and denied any questions.

## 2024-04-06 NOTE — Progress Notes (Signed)
 Patient agreed to take Lipitor medication. Please send RX.

## 2024-04-06 NOTE — Progress Notes (Signed)
 Lipitor 10 mg every day, if pt notices side effect, stop the pill for 3  days and restart, repeat 3 times before stopping all together.

## 2024-04-25 DIAGNOSIS — H5203 Hypermetropia, bilateral: Secondary | ICD-10-CM | POA: Diagnosis not present

## 2024-04-25 DIAGNOSIS — H2513 Age-related nuclear cataract, bilateral: Secondary | ICD-10-CM | POA: Diagnosis not present

## 2024-04-25 DIAGNOSIS — H401212 Low-tension glaucoma, right eye, moderate stage: Secondary | ICD-10-CM | POA: Diagnosis not present

## 2024-04-25 DIAGNOSIS — H401221 Low-tension glaucoma, left eye, mild stage: Secondary | ICD-10-CM | POA: Diagnosis not present

## 2024-06-19 DIAGNOSIS — H409 Unspecified glaucoma: Secondary | ICD-10-CM | POA: Diagnosis not present

## 2024-07-11 ENCOUNTER — Ambulatory Visit (INDEPENDENT_AMBULATORY_CARE_PROVIDER_SITE_OTHER)

## 2024-07-11 DIAGNOSIS — Z23 Encounter for immunization: Secondary | ICD-10-CM

## 2024-07-11 MED ORDER — COVID-19 MRNA VAC-TRIS(PFIZER) 30 MCG/0.3ML IM SUSY: 0.3000 mL | PREFILLED_SYRINGE | Freq: Once | INTRAMUSCULAR | 0 refills | Status: AC

## 2024-07-11 NOTE — Progress Notes (Unsigned)
 Patient is in office today for a nurse visit for Immunization. Patient Injection was given in the  Left deltoid. Patient tolerated injection well.

## 2024-07-31 DIAGNOSIS — H5203 Hypermetropia, bilateral: Secondary | ICD-10-CM | POA: Diagnosis not present

## 2024-07-31 DIAGNOSIS — H524 Presbyopia: Secondary | ICD-10-CM | POA: Diagnosis not present

## 2024-07-31 DIAGNOSIS — H401221 Low-tension glaucoma, left eye, mild stage: Secondary | ICD-10-CM | POA: Diagnosis not present

## 2024-07-31 DIAGNOSIS — H52223 Regular astigmatism, bilateral: Secondary | ICD-10-CM | POA: Diagnosis not present

## 2024-07-31 DIAGNOSIS — H2513 Age-related nuclear cataract, bilateral: Secondary | ICD-10-CM | POA: Diagnosis not present

## 2024-07-31 DIAGNOSIS — H401212 Low-tension glaucoma, right eye, moderate stage: Secondary | ICD-10-CM | POA: Diagnosis not present

## 2024-09-25 DIAGNOSIS — K080 Exfoliation of teeth due to systemic causes: Secondary | ICD-10-CM | POA: Diagnosis not present

## 2024-09-26 ENCOUNTER — Ambulatory Visit (INDEPENDENT_AMBULATORY_CARE_PROVIDER_SITE_OTHER): Admitting: Family Medicine

## 2024-09-26 ENCOUNTER — Encounter: Payer: Self-pay | Admitting: Family Medicine

## 2024-09-26 VITALS — BP 110/64 | HR 64 | Ht 75.0 in | Wt 188.0 lb

## 2024-09-26 DIAGNOSIS — Z79899 Other long term (current) drug therapy: Secondary | ICD-10-CM

## 2024-09-26 DIAGNOSIS — E782 Mixed hyperlipidemia: Secondary | ICD-10-CM | POA: Diagnosis not present

## 2024-09-26 NOTE — Progress Notes (Signed)
 Established Patient Office Visit  Patient ID: Patrick Mcknight, male    DOB: 1951/07/22  Age: 73 y.o. MRN: [id number] PCP: Anvita Hirata K, MD  Chief Complaint  Patient presents with   Hyperlipidemia    Subjective:     HPI  Discussed the use of AI scribe software for clinical note transcription with the patient, who gave verbal consent to proceed.  History of Present Illness Patrick Mcknight is a 73 year old male who presents for a six-month follow-up visit.  He has experienced no changes in his health over the past six months and has been taking his medications regularly without any issues. No chest pain or shortness of breath. He works out almost every day and watches his salt intake.  He is currently taking baby aspirin, Zetia  for cholesterol, vitamin D3,  eye drops for glaucoma, and Viagra . He underwent a laser procedure at [facility] for a tear, which did not affect his vision.  His cholesterol was higher before starting the new medication, but he is now taking Lipitor and tolerating it well. He has not had any blood work since starting the medication.  He lives alone following the passing of his wife three years ago due to an aneurysm. He does not consume alcohol and has not done so for thirty to forty years. He is retired and maintains an active lifestyle, regularly exercising with a focus on both upper and lower body workouts, including weight lifting and calisthenics. He has a history of competitive running, including marathons, and continues to engage in regular physical activity.  Family history reveals longevity, with his grandmother living to 105 years and his mother nearly reaching 98 years. There is no significant family history of cancer.      Review of Systems  All other systems reviewed and are negative.     Objective:     BP 110/64   Pulse 64   Ht 6' 3 (1.905 m)   Wt 188 lb (85.3 kg)   SpO2 99%   BMI 23.50 kg/m     Physical Exam Vitals and nursing  note reviewed.  Constitutional:      Appearance: Normal appearance.  HENT:     Head: Normocephalic.     Right Ear: External ear normal.     Left Ear: External ear normal.  Eyes:     Conjunctiva/sclera: Conjunctivae normal.  Cardiovascular:     Rate and Rhythm: Normal rate.  Pulmonary:     Effort: Pulmonary effort is normal. No respiratory distress.  Abdominal:     Palpations: Abdomen is soft.  Musculoskeletal:        General: Normal range of motion.  Skin:    General: Skin is warm.  Neurological:     Mental Status: He is alert and oriented to person, place, and time.  Psychiatric:        Mood and Affect: Mood normal.     Physical Exam     No results found for any visits on 09/26/24.     The 10-year ASCVD risk score (Arnett DK, et al., 2019) is: 10%    Assessment & Plan:   Problem List Items Addressed This Visit       Other   Hyperlipemia intolerant statins - Primary   Relevant Orders   Lipid panel   Other Visit Diagnoses       Encounter for long-term current use of medication       Relevant Orders   Comprehensive Metabolic Panel (  CMET)       Assessment and Plan Assessment & Plan Mixed hyperlipidemia Cholesterol levels were previously elevated. Medication is well-tolerated at a low dose. Liver enzymes normalized. - Ordered blood work for cholesterol and liver enzymes. - Continue current cholesterol medication. - Consider dose increase if cholesterol remains high, pending lab results.  Glaucoma Condition well-managed with current treatment. No visual disturbances reported. - Continue current medications. - continue follow up with Ophthalmology.  General Health Maintenance Engages in regular exercise and maintains a healthy lifestyle. No alcohol or smoking history. Family history of longevity without significant cancer. - Continue regular exercise regimen. - Maintain healthy lifestyle choices.    No follow-ups on file.    Patrick K Elois Averitt,  MD [facility] at [facility]

## 2024-09-27 ENCOUNTER — Ambulatory Visit: Payer: Self-pay | Admitting: Family Medicine

## 2024-09-27 LAB — COMPREHENSIVE METABOLIC PANEL WITH GFR
ALT: 35 IU/L (ref 0–44)
AST: 36 IU/L (ref 0–40)
Albumin: 4.2 g/dL (ref 3.8–4.8)
Alkaline Phosphatase: 52 IU/L (ref 47–123)
BUN/Creatinine Ratio: 10 (ref 10–24)
BUN: 12 mg/dL (ref 8–27)
Bilirubin Total: 0.7 mg/dL (ref 0.0–1.2)
CO2: 24 mmol/L (ref 20–29)
Calcium: 9.4 mg/dL (ref 8.6–10.2)
Chloride: 103 mmol/L (ref 96–106)
Creatinine, Ser: 1.16 mg/dL (ref 0.76–1.27)
Globulin, Total: 2.6 g/dL (ref 1.5–4.5)
Glucose: 92 mg/dL (ref 70–99)
Potassium: 4.6 mmol/L (ref 3.5–5.2)
Sodium: 142 mmol/L (ref 134–144)
Total Protein: 6.8 g/dL (ref 6.0–8.5)
eGFR: 67 mL/min/1.73 (ref >59)

## 2024-09-27 LAB — LIPID PANEL
Chol/HDL Ratio: 2.7 ratio (ref 0.0–5.0)
Cholesterol, Total: 160 mg/dL (ref 100–199)
HDL: 59 mg/dL (ref >39)
LDL Chol Calc (NIH): 88 mg/dL (ref 0–99)
Triglycerides: 65 mg/dL (ref 0–149)
VLDL Cholesterol Cal: 13 mg/dL (ref 5–40)

## 2024-09-27 NOTE — Progress Notes (Signed)
 Your cholesterol is well controlled with current regimen, continue medications no changes in dosing. Your kidney and liver function is normal.

## 2024-10-12 ENCOUNTER — Other Ambulatory Visit: Payer: Self-pay | Admitting: Family Medicine

## 2024-10-12 DIAGNOSIS — E78019 Familial hypercholesterolemia, unspecified: Secondary | ICD-10-CM

## 2024-10-12 DIAGNOSIS — Z760 Encounter for issue of repeat prescription: Secondary | ICD-10-CM

## 2024-10-17 NOTE — Telephone Encounter (Signed)
 Requested Prescriptions  Pending Prescriptions Disp Refills   ezetimibe  (ZETIA ) 10 MG tablet [Pharmacy Med Name: EZETIMIBE  10MG  TABLETS] 90 tablet 1    Sig: TAKE 1 TABLET BY MOUTH EVERY DAY     Cardiovascular:  Antilipid - Sterol Transport Inhibitors Failed - 10/17/2024  9:27 AM      Failed - Lipid Panel in normal range within the last 12 months    Cholesterol, Total  Date Value Ref Range Status  09/26/2024 160 100 - 199 mg/dL Final   LDL Chol Calc (NIH)  Date Value Ref Range Status  09/26/2024 88 0 - 99 mg/dL Final   HDL  Date Value Ref Range Status  09/26/2024 59 >39 mg/dL Final   Triglycerides  Date Value Ref Range Status  09/26/2024 65 0 - 149 mg/dL Final         Passed - AST in normal range and within 360 days    AST  Date Value Ref Range Status  09/26/2024 36 0 - 40 IU/L Final         Passed - ALT in normal range and within 360 days    ALT  Date Value Ref Range Status  09/26/2024 35 0 - 44 IU/L Final         Passed - Patient is not pregnant      Passed - Valid encounter within last 12 months    Recent Outpatient Visits           3 weeks ago Mixed hyperlipidemia   [place] [facility], Vinay K, MD   6 months ago Familial hypercholesterolemia   [place] [facility] at [facility] [place], Vinay K, MD               atorvastatin  (LIPITOR) 10 MG tablet [Pharmacy Med Name: ATORVASTATIN  10MG  TABLETS] 90 tablet 1    Sig: TAKE 1 TABLET(10 MG) BY MOUTH DAILY     Cardiovascular:  Antilipid - Statins Failed - 10/17/2024  9:27 AM      Failed - Lipid Panel in normal range within the last 12 months    Cholesterol, Total  Date Value Ref Range Status  09/26/2024 160 100 - 199 mg/dL Final   LDL Chol Calc (NIH)  Date Value Ref Range Status  09/26/2024 88 0 - 99 mg/dL Final   HDL  Date Value Ref Range Status  09/26/2024 59 >39 mg/dL Final   Triglycerides  Date Value Ref Range  Status  09/26/2024 65 0 - 149 mg/dL Final         Passed - Patient is not pregnant      Passed - Valid encounter within last 12 months    Recent Outpatient Visits           3 weeks ago Mixed hyperlipidemia   [place] [facility] [place], Vinay K, MD   6 months ago Familial hypercholesterolemia   [facility] at [facility], Vinay K, MD

## 2025-02-27 ENCOUNTER — Ambulatory Visit: Admitting: Family Medicine

## 2025-04-04 ENCOUNTER — Ambulatory Visit

## 2025-04-12 ENCOUNTER — Ambulatory Visit
